# Patient Record
Sex: Male | Born: 1970 | Race: White | Hispanic: No | Marital: Single | Smoking: Current every day smoker
Health system: Southern US, Community
[De-identification: ages and names within clinical notes are randomized; demographics above are authoritative.]

## PROBLEM LIST (undated history)

## (undated) DIAGNOSIS — R1013 Epigastric pain: Secondary | ICD-10-CM

---

## 2007-01-31 ENCOUNTER — Emergency Department (HOSPITAL_COMMUNITY): Admission: EM | Admit: 2007-01-31 | Discharge: 2007-01-31 | Payer: Self-pay | Admitting: Emergency Medicine

## 2007-02-20 ENCOUNTER — Emergency Department (HOSPITAL_COMMUNITY): Admission: EM | Admit: 2007-02-20 | Discharge: 2007-02-20 | Payer: Self-pay | Admitting: Emergency Medicine

## 2007-06-04 ENCOUNTER — Emergency Department (HOSPITAL_COMMUNITY): Admission: EM | Admit: 2007-06-04 | Discharge: 2007-06-04 | Payer: Self-pay | Admitting: Family Medicine

## 2010-08-05 ENCOUNTER — Ambulatory Visit: Payer: Self-pay | Admitting: Family Medicine

## 2010-08-05 DIAGNOSIS — D179 Benign lipomatous neoplasm, unspecified: Secondary | ICD-10-CM | POA: Insufficient documentation

## 2010-08-05 DIAGNOSIS — N200 Calculus of kidney: Secondary | ICD-10-CM | POA: Insufficient documentation

## 2010-08-05 DIAGNOSIS — B019 Varicella without complication: Secondary | ICD-10-CM | POA: Insufficient documentation

## 2010-12-16 NOTE — Assessment & Plan Note (Signed)
Summary: NEW PATIENT/KNOT ON RIGHT SHOULDER/[facility]   Vital Signs:  Patient profile:   40 year old male Height:      67 inches Weight:      185.25 pounds BMI:     29.12 Temp:     98.5 degrees F oral Pulse rate:   66 / minute Pulse rhythm:   regular BP sitting:   120 / 75  (left arm) Cuff size:   regular  Vitals Entered By: Melody Comas 08-05-10 9:45 CC: New Patient with knot on right shoulder   History of Present Illness: Mass on R shoulder, prev told it was benign.  Stable for years but then recently enlarging.  Occ tender to palpation and with some pain radiating down R arm, noted w/certain movements.   Preventive Screening-Counseling & Management  Alcohol-Tobacco     Smoking Status: current  Caffeine-Diet-Exercise     Does Patient Exercise: no      Drug Use:  no.    Allergies (verified): No Known Drug Allergies  Past History:  Family History: Last updated: 08/05/2010 Family History of Arthritis, parents and grandparents Family History Diabetes 1st degree relative, parents and grandparents Family History Hypertension, parents and grandparents Family History Kidney disease, grandparents Family History Lung cancer, grandparents Family History of Stroke F 1st degree relative <60, grandparents Family history of Heart disease, grandparents F alive healthy M alive arthritis, HTN, DM2  Social History: Last updated: 08/05/2010 Occupation: Copywriter, advertising, Biomedical engineer Single no kids Current Smoker, 5 cigars a day Alcohol use-no Drug use-no Regular exercise- at work   Past Medical History:   CHICKENPOX (ICD-052.9) nephrolithiasis 1995    Family History: Reviewed history and no changes required. Family History of Arthritis, parents and grandparents Family History Diabetes 1st degree relative, parents and grandparents Family History Hypertension, parents and grandparents Family History Kidney disease, grandparents Family History Lung cancer,  grandparents Family History of Stroke F 1st degree relative <60, grandparents Family history of Heart disease, grandparents F alive healthy M alive arthritis, HTN, DM2  Social History: Reviewed history and no changes required. 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  Otherwise negative.    Physical Exam  General:  GEN: nad, alert and oriented HEENT: mucous membranes moist NECK: supple w/o LA CV: rrr.  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  Reassured.  No reason to suspect malignancy.  No LA in axilla.  would like [facility] input on removal, since he has discomfort.   Orders: Surgical Referral (Surgery)  Patient Instructions: 1)  See Aram Beecham about your referral before your leave today.  At some point in the next year, come back for a physical.  You could do it next spring.  Get an early morning appointment and we can draw your labs on the same day.  Come in fasting (don't eat breakfast and don't drink anything other than water or black coffee).  Take care.  Let me know if I can do anything else.    Prior Medications (reviewed today): None Current Allergies (reviewed today): No known  allergies

## 2011-01-26 ENCOUNTER — Encounter: Payer: Self-pay | Admitting: Family Medicine

## 2014-05-09 ENCOUNTER — Inpatient Hospital Stay: Payer: Self-pay | Admitting: Student

## 2014-05-09 LAB — URINALYSIS, COMPLETE
BACTERIA: NONE SEEN
Bilirubin,UR: NEGATIVE
GLUCOSE, UR: NEGATIVE mg/dL (ref 0–75)
Leukocyte Esterase: NEGATIVE
Nitrite: NEGATIVE
PH: 6 (ref 4.5–8.0)
PROTEIN: NEGATIVE
SPECIFIC GRAVITY: 1.056 (ref 1.003–1.030)
Squamous Epithelial: NONE SEEN

## 2014-05-09 LAB — COMPREHENSIVE METABOLIC PANEL
Albumin: 4 g/dL (ref 3.4–5.0)
Alkaline Phosphatase: 92 U/L
Anion Gap: 14 (ref 7–16)
BILIRUBIN TOTAL: 0.7 mg/dL (ref 0.2–1.0)
BUN: 10 mg/dL (ref 7–18)
CHLORIDE: 101 mmol/L (ref 98–107)
CO2: 20 mmol/L — AB (ref 21–32)
Calcium, Total: 9.5 mg/dL (ref 8.5–10.1)
Creatinine: 1.06 mg/dL (ref 0.60–1.30)
EGFR (African American): >60
GLUCOSE: 176 mg/dL — AB (ref 65–99)
OSMOLALITY: 273 (ref 275–301)
POTASSIUM: 3.2 mmol/L — AB (ref 3.5–5.1)
SGOT(AST): 14 U/L — ABNORMAL LOW (ref 15–37)
SGPT (ALT): 17 U/L (ref 12–78)
SODIUM: 135 mmol/L — AB (ref 136–145)
Total Protein: 7.7 g/dL (ref 6.4–8.2)

## 2014-05-09 LAB — CBC WITH DIFFERENTIAL/PLATELET
Basophil #: 0.2 10*3/uL — ABNORMAL HIGH (ref 0.0–0.1)
Basophil %: 1.1 %
EOS ABS: 0 10*3/uL (ref 0.0–0.7)
EOS PCT: 0.2 %
HCT: 49 % (ref 40.0–52.0)
HGB: 16.8 g/dL (ref 13.0–18.0)
LYMPHS ABS: 1.2 10*3/uL (ref 1.0–3.6)
LYMPHS PCT: 6.7 %
MCH: 31.9 pg (ref 26.0–34.0)
MCHC: 34.2 g/dL (ref 32.0–36.0)
MCV: 93 fL (ref 80–100)
MONO ABS: 1.1 x10 3/mm — AB (ref 0.2–1.0)
Monocyte %: 5.9 %
Neutrophil #: 15.4 10*3/uL — ABNORMAL HIGH (ref 1.4–6.5)
Neutrophil %: 86.1 %
PLATELETS: 243 10*3/uL (ref 150–440)
RBC: 5.26 10*6/uL (ref 4.40–5.90)
RDW: 13.6 % (ref 11.5–14.5)
WBC: 17.9 10*3/uL — AB (ref 3.8–10.6)

## 2014-05-09 LAB — LIPASE, BLOOD: Lipase: 110 U/L (ref 73–393)

## 2014-05-09 LAB — TROPONIN I: Troponin-I: <0.02 ng/mL

## 2014-05-10 LAB — MAGNESIUM: MAGNESIUM: 1.8 mg/dL

## 2014-05-10 LAB — COMPREHENSIVE METABOLIC PANEL
Albumin: 2.9 g/dL — ABNORMAL LOW (ref 3.4–5.0)
Alkaline Phosphatase: 71 U/L
Anion Gap: 6 — ABNORMAL LOW (ref 7–16)
BUN: 8 mg/dL (ref 7–18)
Bilirubin,Total: 0.4 mg/dL (ref 0.2–1.0)
CO2: 24 mmol/L (ref 21–32)
Calcium, Total: 8.1 mg/dL — ABNORMAL LOW (ref 8.5–10.1)
Chloride: 110 mmol/L — ABNORMAL HIGH (ref 98–107)
Creatinine: 1.07 mg/dL (ref 0.60–1.30)
EGFR (African American): >60
GLUCOSE: 83 mg/dL (ref 65–99)
Osmolality: 277 (ref 275–301)
POTASSIUM: 3.6 mmol/L (ref 3.5–5.1)
SGOT(AST): 16 U/L (ref 15–37)
SGPT (ALT): 11 U/L — ABNORMAL LOW (ref 12–78)
Sodium: 140 mmol/L (ref 136–145)
TOTAL PROTEIN: 6 g/dL — AB (ref 6.4–8.2)

## 2014-05-10 LAB — CBC WITH DIFFERENTIAL/PLATELET
BASOS ABS: 0 10*3/uL (ref 0.0–0.1)
BASOS PCT: 0.2 %
EOS PCT: 1.5 %
Eosinophil #: 0.2 10*3/uL (ref 0.0–0.7)
HCT: 39.8 % — AB (ref 40.0–52.0)
HGB: 13.6 g/dL (ref 13.0–18.0)
LYMPHS ABS: 2.5 10*3/uL (ref 1.0–3.6)
LYMPHS PCT: 25.4 %
MCH: 32.1 pg (ref 26.0–34.0)
MCHC: 34.1 g/dL (ref 32.0–36.0)
MCV: 94 fL (ref 80–100)
MONOS PCT: 9.6 %
Monocyte #: 1 x10 3/mm (ref 0.2–1.0)
NEUTROS ABS: 6.3 10*3/uL (ref 1.4–6.5)
Neutrophil %: 63.3 %
PLATELETS: 210 10*3/uL (ref 150–440)
RBC: 4.22 10*6/uL — ABNORMAL LOW (ref 4.40–5.90)
RDW: 13.7 % (ref 11.5–14.5)
WBC: 10 10*3/uL (ref 3.8–10.6)

## 2014-05-10 LAB — HEMOGLOBIN A1C: HEMOGLOBIN A1C: 5.6 % (ref 4.2–6.3)

## 2014-05-11 LAB — URINE CULTURE

## 2014-05-14 LAB — CULTURE, BLOOD (SINGLE)

## 2015-03-09 NOTE — H&P (Signed)
PATIENT NAME:  Tyler Bennett, Tyler Bennett MR#:  [id number] DATE OF BIRTH:  [birth year]  DATE OF ADMISSION:  05/09/2014  PRIMARY CARE PHYSICIAN: None.   REFERRING PHYSICIAN: Hinda Kehr, MD.  REASON FOR ADMISSION/CHIEF COMPLAINT: Diarrhea, dehydration, nausea and vomiting, and abdominal pain.   HISTORY OF PRESENT ILLNESS: This is a very nice 44 year old gentleman who is overall healthy. No significant prior Medi-Cal problems.   The patient comes today with a history of 3-4 days of nausea, vomiting, diarrhea, and abdominal pain. The patient states that Saturday night/Sunday morning he started having significant knife pain on the stomach, located in the left upper quadrant.   The patient had bowel movements every couple hours and pains were waxing and waning about the same time. The patient states that for the past 3 days, he has been vomiting at least 20 times in the period of 24 hours. He developed vomiting first, followed by diarrhea. The diarrhea was mostly mucus-looking, brown color, and watery. The patient denies any blood on the stool. Only when he wipes, he has streaks of blood from the irritation.   The patient denies any recent travel, any sick contact. His wife is here with him and she is not sick, and they have been together all the time. The patient states that the only food of unknown president that he had was [employer] fast food and, then last week, [place]. The patient states that his pain is located on the left upper quadrant. Right now, the intensity is 3/10 if he just lying down. Could get up to 10/10, or at least it was whenever he arrived. The pain is not radiating, felt like a knife stab and occasionally cramping. The patient states that it is not better with changes in position.  It is not better before or after bowel movements. He just happens and it goes away by itself.   The patient and has been evaluated by Dr. Karma Greaser, who noticed that his heart  rate is low in the 50s and 40s and that he had systemic inflammatory response syndrome with a white blood count of 17.9 and a lactic acid of 1.9. The patient admitted for evaluation and treatment of this condition.  REVIEW OF SYSTEMS:  Twelve system review of system is done.  CONSTITUTIONAL: The patient denies any fever. Positive fatigue. Positive weakness. No significant weight loss or weight gain.  EYES: No blurry vision, double vision.  EARS, NOSE, THROAT: No difficulty swallowing. Positive congestion at the level of the right ear mostly. Patient states that he feels very congested and occasionally has pain.  RESPIRATORY: No cough, wheezing.  CARDIOVASCULAR: No chest pain, orthopnea, or syncope.  GASTROINTESTINAL: As mentioned above on HPI, no melena, no hematemesis, and no jaundice.  GENITOURINARY: No dysuria or hematuria. Positive decreased urinary frequency.  The patient barely urinated for the past 2 days.  ENDOCRINE: No polyuria, polydipsia, polyphagia.  HEMATOLOGIC AND LYMPHATIC: No anemia, easy bruising.  SKIN: No rashes or petechiae.   MUSCULOSKELETAL: No significant neck pain or back pain.  NEUROLOGIC: No numbness or tingling.  PSYCHIATRIC: No anxiety or insomnia.   PAST MEDICAL HISTORY: None.   ALLERGIES: NOT KNOWN DRUG ALLERGIES.   PAST SURGICAL HISTORY: None.   SOCIAL HISTORY: The patient is a smoker of 1 pack a day for 30 years. He does not drink alcohol. He works as an Clinical biochemist. Lives with his wife or significant other.   FAMILY HISTORY: Barbaraann Rondo had an MI.  His father had skin cancer. His grandfather had lung cancer.   MEDICATIONS: He does not take any over-the-counter or prescribed medications.   PHYSICAL EXAMINATION:  VITAL SIGNS: Blood pressure 134/85, pulse in between 47 and 55, respirations in between 12 and 18, oxygen saturation 100% on room air, temperature 97.9. GENERAL:  Alert and oriented x 3. No acute distress. No respiratory distress.  Blood pressure 121/75.   Patient is hemodynamically stable.  HEENT: His pupils are equal and reactive. Extraocular movements are intact. Mucosae are dry. Anicteric sclerae. Pink conjunctivae. No oral lesions. No oropharyngeal exudates.  NECK: Supple. No JVD. No thyromegaly. No adenopathy.  CARDIOVASCULAR: Regular rate and rhythm. No murmurs, rubs or gallops are appreciated. No displacement of PMI.  LUNGS: Clear without any wheezing or crepitus. No use of accessory muscles.  ABDOMEN: Soft, nontender, nondistended. No hepatosplenomegaly. No masses. Bowel sounds are positive. The patient has no tenderness to palpation of the abdomen whatsoever. At this moment, maybe his abdominal sounds are normal versus a little hyper, but not significant.  No  rebound tenderness. No guarding. GENITOURINARY:  Deferred. EXTREMITIES: No edema, cyanosis, clubbing. Pulses +2. Capillary refill less than 3.  LYMPHATIC: Negative for lymphadenopathy in the neck or supraclavicular areas.  SKIN: No rashes or petechiae. Has positive decreased turgor.  PSYCHIATRIC:  Mood is normal without any significant agitation. Alert and oriented x 3.  NEUROLOGIC: Cranial nerves II-XII intact. Strength is 5/5 in all four extremities.  VASCULAR: Pulses +2. Capillary refill less than 3.   LABORATORY RESULTS: Creatinine is 1.06. His glucose is 176. The patient denies any history of diabetes. Sodium is 135, potassium is 3.2, CO2 is low at 20, electrolytes within normal limits.  LFTs, overall, within normal limits. Troponin is negative. White blood count is 17.9, hemoglobin is 16, platelet count is 243. Lactic acid is 1.9.   ABDOMEN: Flat, no acute abnormalities. Chest x-ray, also no acute abnormalities. CT scan of the abdomen and pelvis with IV contrast, but not p.o. 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 Gallbladder is normal, nonobstructive bowel gas pattern in the colon. Colonies collapse without definite mucosal  cell edema.   EKG: No ST depression or elevation. The patient had occasional PACs. The patient had some QRS without piece, looks more like junctional bradycardia. No ST depression or elevation.   ASSESSMENT AND PLAN: This is a very nice 44 year old gentleman who is healthy overall, comes in with significant diarrhea, nausea, vomiting and left upper quadrant abdominal pain.   1.  Acute gastroenteritis. At this moment, the patient is going to be admitted based on his symptomatology. Unable to tolerate p.o., significantly dehydrated with elevation of white blood count.  He does not serous criteria just yet as he only has elevation of white blood cells, but he is definitely significantly ill, for what we are going to admit him to the hospital. Treat him with IV fluids and antibiotics as he had significant leukocytosis.   The patient has been exposed too fast foods, may uncooked meat and on top of that, he has been on the [place] fishing for what we are going to cover for the most common bacteria, in this case Escherichia coli. Also, we are going to treat him for possible Giardia infection as his stool is very mucousy and could have some steatorrhea. The patient is going to be treated with ciprofloxacin and metronidazole for now. Stool culture sent. Consult GI if symptoms do not improve. At this  moment, I am not going to start any anti-diuretics until all  samples are collected and we have some results back.  Make sure he does not have Clostridium difficile.   2.  Severe dehydration. The patient looks very dry.   He has hyponatremia, hypokalemia secondary to the diarrhea, and he has known anion gap metabolic acidosis, which is also secondary to loss of bicarbonate on the stool. IV fluids given for this. Continue aggressive hydration.   3.  Leukocytosis as mentioned above. This is likely secondary to gastroenteritis. Also could be secondary to bulging compression. Treated with antibiotics. Cultures have been  sent.   4.  Hypokalemia. Replace with IV fluids with potassium.  5.  Hyponatremia. Also need to intravascular volume depletion.   6.  Hyperglycemia. The patient denies any history of diabetes at this moment. This could be secondary to stress related hormones. We are going to send a hemoglobin A1c.   7.  Lactic acidosis secondary to dehydration, likely the patient has known anion gap metabolic acidosis, which is likely due to the diarrhea.  8.  Deep vein thrombosis prophylaxis with Lovenox.   9.  Gastrointestinal prophylaxis with Protonix.   I spent about 45 minutes with this patient.  Pain control and nausea control given.     ____________________________ Sanpete Sink, MD rsg:ts D: 05/09/2014 12:24:23 ET T: 05/09/2014 13:18:27 ET JOB#: [id number]  cc: Gage Sink, MD, <Dictator> ROBERTO America Brown MD ELECTRONICALLY SIGNED 05/11/2014 15:14

## 2015-03-09 NOTE — Discharge Summary (Signed)
PATIENT NAME:  Tyler Bennett, Tyler Bennett MR#:  [id number] DATE OF BIRTH:  June 05, 1971  DATE OF ADMISSION:  05/09/2014 DATE OF DISCHARGE:  05/10/2014  The patient got admitted yesterday by Dr. Laurin Coder. The patient walked out against medical advice prior to being seen earlier this afternoon without finishing the work-up.   DIAGNOSIS AT TIME OF ADMISSION:  1.  Possible acute gastroenteritis. 2.  Dehydration. 3.  Leukocytosis. 4.  Hypokalemia. 5.  Hyponatremia.  6.  Lactic acidosis.  Work-up, of note, at this time is not complete. He was on fluids. He was on Norco p.r.n., Cipro 400 mg q. 12 hours and Flagyl 500 mg q. 8 hours, both IV, Lovenox, fluticasone nasal spray, Protonix, Zofran, promethazine p.r.n.   DIAGNOSTIC DATA: Blood cultures showed no growth to date thus far. Urine culture no growth to date thus far. UA did not suggest an infection. Lactic acid level on arrival was 1.9. White count on admission 17.9, hemoglobin 16.8. Sodium 135, potassium 3.2. Last sodium 140 and potassium 3.6.   CT of abdomen and pelvis with contrast: Small hepatic lesions consistent with hemangiomata. No acute abnormality.   Chest x-ray, 1 view, on the 24th: No active disease.   HISTORY OF PRESENT ILLNESS AND HOSPITAL COURSE: For full details of H and P, please see the dictation on June 24 by Dr. Laurin Coder, but this is a 44 year old male who has no significant past medical history, came in with 3 to 4 days of nausea, vomiting, diarrhea, and abdominal pain, and per admitting physician was dehydrated, was started on fluids. There is no recent travel or sick contacts. Of note, the patient walked out against medical advice prior to being seen by me. The risk of walking out against medical advice was explained to the patient by the staff who nonetheless still walked out against medical advice. Per staff, the family was more concerned about billing and financial implications and billing by me and left. Of note, today the white count  is normalized to 10.   ____________________________ Vivien Presto, MD sa:sb D: 05/10/2014 13:25:55 ET T: 05/10/2014 14:40:05 ET JOB#: [id number]  cc: Vivien Presto, MD, <Dictator> Vivien Presto MD ELECTRONICALLY SIGNED 05/15/2014 18:19

## 2015-03-09 NOTE — H&P (Signed)
PATIENT NAME:  Tyler Bennett, Tyler Bennett MR#:  [id number] DATE OF BIRTH:  01/10/1971  DATE OF ADMISSION:  05/09/2014  Addendum  OTHER PROBLEMS: Ear congestion. The patient has, on examination, some fluid behind the tympanic membrane. It does not look purulent, looks just serous. At this moment, we are going to give him some nasal steroids to facilitate any drainage of secretions. If this is not sufficient, once his abdominal pain, diarrhea and nausea are better, we can add on some decongestants. The antibiotics that the patient is going to have, the ciprofloxacin, might help if this is otitis media, although clinically, it does not seem like an otitis media. 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   Smoking. The patient has smoking problems. He had significant amounts of nicotine that he uses a day. Smoking cessation counseling given to the patient for over 4 minutes. He also is going to have a nicotine patch available.    ____________________________ La Jara Sink, MD rsg:lb D: 05/09/2014 13:40:07 ET T: 05/09/2014 14:40:42 ET JOB#: [id number]  cc: Gibbon Sink, MD, <Dictator> ROBERTO America Brown MD ELECTRONICALLY SIGNED 05/11/2014 15:14

## 2015-05-22 ENCOUNTER — Emergency Department (HOSPITAL_COMMUNITY): Payer: Self-pay

## 2015-05-22 ENCOUNTER — Inpatient Hospital Stay (HOSPITAL_COMMUNITY)
Admission: EM | Admit: 2015-05-22 | Discharge: 2015-05-25 | DRG: 287 | Disposition: A | Discharge status: Home / Self Care | Payer: Self-pay | Attending: Internal Medicine | Admitting: Internal Medicine

## 2015-05-22 ENCOUNTER — Encounter (HOSPITAL_COMMUNITY): Payer: Self-pay

## 2015-05-22 DIAGNOSIS — R1110 Vomiting, unspecified: Secondary | ICD-10-CM | POA: Diagnosis present

## 2015-05-22 DIAGNOSIS — Z7982 Long term (current) use of aspirin: Secondary | ICD-10-CM

## 2015-05-22 DIAGNOSIS — R931 Abnormal findings on diagnostic imaging of heart and coronary circulation: Secondary | ICD-10-CM | POA: Diagnosis not present

## 2015-05-22 DIAGNOSIS — R001 Bradycardia, unspecified: Secondary | ICD-10-CM | POA: Diagnosis present

## 2015-05-22 DIAGNOSIS — R079 Chest pain, unspecified: Principal | ICD-10-CM | POA: Diagnosis present

## 2015-05-22 DIAGNOSIS — F17200 Nicotine dependence, unspecified, uncomplicated: Secondary | ICD-10-CM | POA: Diagnosis present

## 2015-05-22 DIAGNOSIS — Z791 Long term (current) use of non-steroidal anti-inflammatories (NSAID): Secondary | ICD-10-CM

## 2015-05-22 DIAGNOSIS — R112 Nausea with vomiting, unspecified: Secondary | ICD-10-CM | POA: Diagnosis present

## 2015-05-22 LAB — HEPATIC FUNCTION PANEL
ALK PHOS: 79 U/L (ref 38–126)
ALT: 14 U/L — AB (ref 17–63)
AST: 20 U/L (ref 15–41)
Albumin: 4.8 g/dL (ref 3.5–5.0)
Bilirubin, Direct: <0.1 mg/dL — ABNORMAL LOW (ref 0.1–0.5)
TOTAL PROTEIN: 8.2 g/dL — AB (ref 6.5–8.1)
Total Bilirubin: 0.9 mg/dL (ref 0.3–1.2)

## 2015-05-22 LAB — BASIC METABOLIC PANEL
ANION GAP: 12 (ref 5–15)
BUN: 12 mg/dL (ref 6–20)
CHLORIDE: 105 mmol/L (ref 101–111)
CO2: 23 mmol/L (ref 22–32)
Calcium: 9.6 mg/dL (ref 8.9–10.3)
Creatinine, Ser: 0.99 mg/dL (ref 0.61–1.24)
Glucose, Bld: 192 mg/dL — ABNORMAL HIGH (ref 65–99)
POTASSIUM: 4.4 mmol/L (ref 3.5–5.1)
SODIUM: 140 mmol/L (ref 135–145)

## 2015-05-22 LAB — CBC
HEMATOCRIT: 44.2 % (ref 39.0–52.0)
Hemoglobin: 15.1 g/dL (ref 13.0–17.0)
MCH: 32 pg (ref 26.0–34.0)
MCHC: 34.2 g/dL (ref 30.0–36.0)
MCV: 93.6 fL (ref 78.0–100.0)
PLATELETS: 278 10*3/uL (ref 150–400)
RBC: 4.72 MIL/uL (ref 4.22–5.81)
RDW: 13.5 % (ref 11.5–15.5)
WBC: 14.4 10*3/uL — AB (ref 4.0–10.5)

## 2015-05-22 LAB — I-STAT TROPONIN, ED: Troponin i, poc: 0 ng/mL (ref 0.00–0.08)

## 2015-05-22 LAB — BRAIN NATRIURETIC PEPTIDE: B Natriuretic Peptide: 118.9 pg/mL — ABNORMAL HIGH (ref 0.0–100.0)

## 2015-05-22 LAB — LIPASE, BLOOD: Lipase: 12 U/L — ABNORMAL LOW (ref 22–51)

## 2015-05-22 LAB — TROPONIN I: Troponin I: <0.03 ng/mL (ref <0.031)

## 2015-05-22 MED ORDER — SODIUM CHLORIDE 0.9 % IV BOLUS (SEPSIS)
1000.0000 mL | Freq: Once | INTRAVENOUS | Status: AC
  Administered 2015-05-22: 1000 mL via INTRAVENOUS

## 2015-05-22 MED ORDER — ONDANSETRON HCL 4 MG/2ML IJ SOLN
4.0000 mg | Freq: Once | INTRAMUSCULAR | Status: AC
  Administered 2015-05-22: 4 mg via INTRAVENOUS
  Filled 2015-05-22: qty 2

## 2015-05-22 MED ORDER — MORPHINE SULFATE 2 MG/ML IJ SOLN
2.0000 mg | INTRAMUSCULAR | Status: DC | PRN
  Administered 2015-05-22: 2 mg via INTRAVENOUS
  Filled 2015-05-22: qty 1

## 2015-05-22 MED ORDER — NITROGLYCERIN 0.4 MG SL SUBL: 0.4000 mg | SUBLINGUAL_TABLET | SUBLINGUAL | Status: DC | PRN

## 2015-05-22 MED ORDER — ONDANSETRON HCL 4 MG/2ML IJ SOLN
4.0000 mg | Freq: Once | INTRAMUSCULAR | Status: DC
  Filled 2015-05-22: qty 2

## 2015-05-22 MED ORDER — ASPIRIN 81 MG PO CHEW
324.0000 mg | CHEWABLE_TABLET | Freq: Once | ORAL | Status: AC
  Administered 2015-05-22: 324 mg via ORAL
  Filled 2015-05-22: qty 4

## 2015-05-22 NOTE — H&P (Signed)
PCP: no pcp    Referring provider [place]   Chief Complaint:  Chest pain  HPI: Tyler Bennett is a 44 y.o. male   has no past medical history on file.   Presented with chest pain since 9 am after working on the roof. Decribes as squeezing sensation in epigastric area. Worse with eating or drinking. Every time he tries to eat he vomits. CXR was unremarkable. WBC elevated to 14. He reports some chills. Troponin is unremarkable. She attempted to take aspirin but vomited. Pain improved with nitro for 10 minutes but since has restarted. Describes pain as repeatedly squeezing. Given pulsatile nature of abdominal chest pain CT angiogram of abdomen and chest has been ordered which showed no no evidence of aortic dissection no PE. It did show some possible hepatic hemangiomas but otherwise unremarkable. Patient requested something to drink and was not able to tolerate fluids. Patient prompted lately vomited after drinking orange juice and his chest pain has resumed. At this point we'll admit for rehydration and supportive therapy for persistent nausea and vomiting. Serial EKG was unchanged. Troponin negative 2. Patient wasn't noted to be somewhat bradycardic down to 46.  Hospitalist was called for admission for chest pain/epigastric pain with intractable nausea and vomiting.  Review of Systems:    Pertinent positives include:  Chills, epigastric pain. shortness of breath at rest.  Constitutional:  No weight loss, night sweats, Fevers,  fatigue, weight loss  HEENT:  No headaches, Difficulty swallowing,Tooth/dental problems,Sore throat,  No sneezing, itching, ear ache, nasal congestion, post nasal drip,  Cardio-vascular:  No chest pain, Orthopnea, PND, anasarca, dizziness, palpitations.no Bilateral lower extremity swelling  GI:  No heartburn, indigestion, abdominal pain, nausea, vomiting, diarrhea, change in bowel habits, loss of appetite, melena, blood in stool, hematemesis Resp:  no  No  dyspnea on exertion, No excess mucus, no productive cough, No non-productive cough, No coughing up of blood.No change in color of mucus.No wheezing. Skin:  no rash or lesions. No jaundice GU:  no dysuria, change in color of urine, no urgency or frequency. No straining to urinate.  No flank pain.  Musculoskeletal:  No joint pain or no joint swelling. No decreased range of motion. No back pain.  Psych:  No change in mood or affect. No depression or anxiety. No memory loss.  Neuro: no localizing neurological complaints, no tingling, no weakness, no double vision, no gait abnormality, no slurred speech, no confusion  Otherwise ROS are negative except for above, 10 systems were reviewed  Past Medical History: History reviewed. No pertinent past medical history. History reviewed. No pertinent past surgical history.   Medications: Prior to Admission medications   Medication Sig Start Date End Date Taking? Authorizing Provider  AMOXICILLIN PO Take 1 capsule by mouth 3 (three) times daily.   Yes Historical Provider, MD  anti-nausea (EMETROL) solution Take 15 mLs by mouth every 15 (fifteen) minutes as needed for nausea or vomiting.   Yes Historical Provider, MD  aspirin 81 MG chewable tablet Chew 81 mg by mouth once.   Yes Historical Provider, MD  ibuprofen (ADVIL,MOTRIN) 200 MG tablet Take 400 mg by mouth every 6 (six) hours as needed for moderate pain.   Yes Historical Provider, MD    Allergies:  No Known Allergies  Social History:  Ambulatory   independently   Lives at home alone,       reports that he has been smoking.  He does not have any smokeless tobacco history on file. He  reports that he does not drink alcohol or use illicit drugs.    Family History: family history includes CAD in his other; Diabetes type II in his mother; Heart failure in his mother; Hyperlipidemia in his father.    Physical Exam: Patient Vitals for the past 24 hrs:  BP Temp Temp src Pulse Resp SpO2  Height Weight  05/22/15 2223 111/64 mmHg - - 68 18 100 % - -  05/22/15 2123 127/66 mmHg - - 60 18 100 % - -  05/22/15 2020 130/80 mmHg 98.1 F (36.7 C) Oral 60 18 97 % 5\' 8"  (1.727 m) 81.647 kg (180 lb)    1. General:  in No Acute distress 2. Psychological: Alert and   Oriented 3. Head/ENT:   Moist  Mucous Membranes                          Head Non traumatic, neck supple                          Normal  Dentition 4. SKIN:  decreased Skin turgor,  Skin clean Dry and intact no rash 5. Heart: Regular rate and rhythm systolic Murmur noted, Rub or gallop 6. Lungs: Clear to auscultation bilaterally, no wheezes or crackles   7. Abdomen: Soft, non-tender, Non distended,   8. Lower extremities: no clubbing, cyanosis, or edema 9. Neurologically Grossly intact, moving all 4 extremities equally 10. MSK: Normal range of motion  body mass index is 27.38 kg/(m^2).   Labs on Admission:   Results for orders placed or performed during the hospital encounter of 05/22/15 (from the past 24 hour(s))  CBC     Status: Abnormal   Collection Time: 05/22/15  8:52 PM  Result Value Ref Range   WBC 14.4 (H) 4.0 - 10.5 K/uL   RBC 4.72 4.22 - 5.81 MIL/uL   Hemoglobin 15.1 13.0 - 17.0 g/dL   HCT 44.2 39.0 - 52.0 %   MCV 93.6 78.0 - 100.0 fL   MCH 32.0 26.0 - 34.0 pg   MCHC 34.2 30.0 - 36.0 g/dL   RDW 13.5 11.5 - 15.5 %   Platelets 278 150 - 400 K/uL  Basic metabolic panel     Status: Abnormal   Collection Time: 05/22/15  8:52 PM  Result Value Ref Range   Sodium 140 135 - 145 mmol/L   Potassium 4.4 3.5 - 5.1 mmol/L   Chloride 105 101 - 111 mmol/L   CO2 23 22 - 32 mmol/L   Glucose, Bld 192 (H) 65 - 99 mg/dL   BUN 12 6 - 20 mg/dL   Creatinine, Ser 0.99 0.61 - 1.24 mg/dL   Calcium 9.6 8.9 - 10.3 mg/dL   GFR calc non Af Amer >60 >60 mL/min   GFR calc Af Amer >60 >60 mL/min   Anion gap 12 5 - 15  BNP (order ONLY if patient complains of dyspnea/SOB AND you have documented it for THIS visit)     Status:  Abnormal   Collection Time: 05/22/15  8:52 PM  Result Value Ref Range   B Natriuretic Peptide 118.9 (H) 0.0 - 100.0 pg/mL  I-stat troponin, ED  (not at [facility])     Status: None   Collection Time: 05/22/15  8:55 PM  Result Value Ref Range   Troponin i, poc 0.00 0.00 - 0.08 ng/mL   Comment 3  Lipase, blood     Status: Abnormal   Collection Time: 05/22/15  8:56 PM  Result Value Ref Range   Lipase 12 (L) 22 - 51 U/L  Hepatic function panel     Status: Abnormal   Collection Time: 05/22/15  8:56 PM  Result Value Ref Range   Total Protein 8.2 (H) 6.5 - 8.1 g/dL   Albumin 4.8 3.5 - 5.0 g/dL   AST 20 15 - 41 U/L   ALT 14 (L) 17 - 63 U/L   Alkaline Phosphatase 79 38 - 126 U/L   Total Bilirubin 0.9 0.3 - 1.2 mg/dL   Bilirubin, Direct <0.1 (L) 0.1 - 0.5 mg/dL   Indirect Bilirubin NOT CALCULATED 0.3 - 0.9 mg/dL    UA ordered  No results found for: HGBA1C  Estimated Creatinine Clearance: 91.2 mL/min (by C-G formula based on Cr of 0.99).  BNP (last 3 results) No results for input(s): PROBNP in the last 8760 hours.  Other results:  I have pearsonaly reviewed this: ECG REPORT  Rate: 49  Rhythm: abnormal R wave progression ST&T Change: no ischemic changes   Filed Weights   05/22/15 2020  Weight: 81.647 kg (180 lb)     Cultures: No results found for: SDES, SPECREQUEST, CULT, REPTSTATUS   Radiological Exams on Admission: Dg Chest Port 1 View  05/22/2015   CLINICAL DATA:  Mid chest pain. Shortness of breath. Productive cough. Vomiting.  EXAM: PORTABLE CHEST - 1 VIEW  COMPARISON:  05/09/2014  FINDINGS: The heart size and mediastinal contours are within normal limits. Both lungs are clear. The visualized skeletal structures are unremarkable.  IMPRESSION: No active disease.   Electronically Signed   By: Earle Gell M.D.   On: 05/22/2015 20:50    Chart has been reviewed  Family  at  Bedside  plan of care was discussed with   Danelle Berry  [phone]  Assessment/Plan  44 year old gentleman with sudden onset of chest pain was factors involving tobacco abuse, persistent nausea and vomiting. Troponin negative 2. EKG with nonspecific changes. We'll admit for father evaluation acute coronary syndrome somewhat unlikely given the chest pain going on since 9 AM so far work up is normal   Present on Admission:  . Chest pain - Will admit to telemetry cycle cardiac enzymes. Obtain echo gram check lipid panel check TSH  . Intractable nausea and vomiting - etiology unclear could be gastroenteritis. Lipase normal CT scan of abdomen is unremarkable. We'll continue to monitor give IV fluids give Reglan and GI cocktail Bradycardia. Your records patient has had low heart rate in the past although never quieted as low. Appears to be sinus bradycardia on EKG we will obtain echo and check TSH  Prophylaxis:   Lovenox   CODE STATUS:  FULL CODE   as per patient    Disposition:  To home once workup is complete and patient is stable  Other plan as per orders.  I have spent a total of 55 min on this admission  Zyden Suman 05/22/2015, 11:34 PM  [facility]  Pager [phone]   after 2 AM please page floor coverage PA If 7AM-7PM, please contact the day team taking care of the patient  [email or web address]  Password [email or web address]

## 2015-05-22 NOTE — ED Notes (Signed)
Pt complains of a pressure squeezing type chest pain since earlier today, he is also vomiting in triage and complains of being nauseated.

## 2015-05-22 NOTE — ED Provider Notes (Addendum)
CSN: [id number]     Arrival date & time 05/22/15  2010 History   First MD Initiated Contact with Patient 05/22/15 2117     Chief Complaint  Patient presents with  . Chest Pain     (Consider location/radiation/quality/duration/timing/severity/associated sxs/prior Treatment) HPI  44 year old male presents with chest pain that he describes as squeezing in the middle of his chest since 9 or 10 AM. The pain has been constant with intermittent nausea and vomiting. He feels short of breath with the pain as well. Earlier in the day he was feeling bilateral shoulder numbness that has since resolved. Getting up and walking seems to make the pain worse. Occasionally the pain does worsen and he becomes more pale and "disoriented". Patient is ever had pain like this before. He has not tried eating due to the vomiting. There is no abdominal pain, only lower chest pain. Pain does not radiate to his back. Some increased pain with inspiration. No cough or fevers.  History reviewed. No pertinent past medical history. History reviewed. No pertinent past surgical history. History reviewed. No pertinent family history. History  Substance Use Topics  . Smoking status: Current Every Day Smoker  . Smokeless tobacco: Not on file  . Alcohol Use: Yes    Review of Systems  Constitutional: Positive for fever and chills.  Respiratory: Positive for shortness of breath.   Cardiovascular: Positive for chest pain. Negative for leg swelling.  Gastrointestinal: Positive for nausea and vomiting. Negative for abdominal pain.  Skin: Positive for pallor.  Neurological: Positive for dizziness.  All other systems reviewed and are negative.     Allergies  Review of patient's allergies indicates no known allergies.  Home Medications   Prior to Admission medications   Medication Sig Start Date End Date Taking? Authorizing Provider  AMOXICILLIN PO Take 1 capsule by mouth 3 (three) times daily.   Yes Historical Provider,  MD  anti-nausea (EMETROL) solution Take 15 mLs by mouth every 15 (fifteen) minutes as needed for nausea or vomiting.   Yes Historical Provider, MD  aspirin 81 MG chewable tablet Chew 81 mg by mouth once.   Yes Historical Provider, MD  ibuprofen (ADVIL,MOTRIN) 200 MG tablet Take 400 mg by mouth every 6 (six) hours as needed for moderate pain.   Yes Historical Provider, MD   BP 127/66 mmHg  Pulse 60  Temp(Src) 98.1 F (36.7 C) (Oral)  Resp 18  Ht 5\' 8"  (1.727 m)  Wt 180 lb (81.647 kg)  BMI 27.38 kg/m2  SpO2 100% Physical Exam  Constitutional: He is oriented to person, place, and time. He appears well-developed and well-nourished. No distress.  HENT:  Head: Normocephalic and atraumatic.  Right Ear: External ear normal.  Left Ear: External ear normal.  Nose: Nose normal.  Eyes: Right eye exhibits no discharge. Left eye exhibits no discharge.  Neck: Neck supple.  Cardiovascular: Normal rate, regular rhythm, normal heart sounds and intact distal pulses.   Pulses:      Radial pulses are 2+ on the right side, and 2+ on the left side.  Pulmonary/Chest: Effort normal and breath sounds normal. He exhibits no tenderness.  Abdominal: Soft. He exhibits no distension. There is no tenderness.  Musculoskeletal: He exhibits no edema.  Neurological: He is alert and oriented to person, place, and time.  Skin: Skin is warm and dry. He is not diaphoretic. There is pallor.  Nursing note and vitals reviewed.   ED Course  Procedures (including critical care time) Labs Review Labs  Reviewed  CBC - Abnormal; Notable for the following:    WBC 14.4 (*)    All other components within normal limits  BASIC METABOLIC PANEL - Abnormal; Notable for the following:    Glucose, Bld 192 (*)    All other components within normal limits  BRAIN NATRIURETIC PEPTIDE  LIPASE, BLOOD  HEPATIC FUNCTION PANEL  Randolm Idol, ED    Imaging Review Dg Chest Port 1 View  05/22/2015   CLINICAL DATA:  Mid chest pain.  Shortness of breath. Productive cough. Vomiting.  EXAM: PORTABLE CHEST - 1 VIEW  COMPARISON:  05/09/2014  FINDINGS: The heart size and mediastinal contours are within normal limits. Both lungs are clear. The visualized skeletal structures are unremarkable.  IMPRESSION: No active disease.   Electronically Signed   By: Earle Gell M.D.   On: 05/22/2015 20:50     EKG Interpretation   Date/Time:  Wednesday May 22 2015 20:36:51 EDT Ventricular Rate:  59 PR Interval:  164 QRS Duration: 81 QT Interval:  433 QTC Calculation: 429 R Axis:   58 Text Interpretation:  Sinus rhythm Left atrial enlargement ST elevation,  consider inferior injury Since last tracing Nonspecific ST abnormality  Confirmed by [facility]  MD, ELLIOTT [phone]) on 05/22/2015 8:41:37 PM        EKG Interpretation  Date/Time:  Wednesday May 22 2015 21:55:45 EDT Ventricular Rate:  49 PR Interval:  168 QRS Duration: 91 QT Interval:  483 QTC Calculation: 436 R Axis:   64 Text Interpretation:  Slow sinus arrhythmia Probable left atrial enlargement Abnormal R-wave progression, early transition no significant change since earlier in the day Confirmed by [place] ([id number]) on 05/22/2015 10:20:57 PM       MDM   Final diagnoses:  Chest pain, unspecified chest pain type    Patient's history is concerning for a cardiac cause of his symptoms. Initial troponin is negative and he has nonspecific EKG changes. Patient's HEART score is a 4. He is low risk for pulmonary embolism and is PERC negative. Patient given aspirin which he threw up as well as Zofran. After this he felt all the symptoms completely resolve never required nitroglycerin. No known drug use, will send UDS after hospitalist request. Will need overnight observation for moderate risk for ACS chest pain.    Sherwood Gambler, MD 05/22/15 2307  Patient's pain has come back, hospitalist is concerned for dissection, will order CTA of chest/abd/pelvis and hold off on  admission to floor until CT is back.  Sherwood Gambler, MD 05/22/15 2352  Sherwood Gambler, MD 05/22/15 2352

## 2015-05-22 NOTE — ED Notes (Signed)
Repeat EKG given to [facility] for review.

## 2015-05-22 NOTE — ED Notes (Signed)
Pt became disoriented after returning from xray

## 2015-05-22 NOTE — ED Notes (Signed)
EKG given to Hospitalist,[place] for review.

## 2015-05-23 ENCOUNTER — Ambulatory Visit (HOSPITAL_COMMUNITY)
Admit: 2015-05-23 | Discharge: 2015-05-23 | Disposition: A | Discharge status: Home / Self Care | Payer: Self-pay | Attending: Cardiology | Admitting: Cardiology

## 2015-05-23 ENCOUNTER — Ambulatory Visit (HOSPITAL_COMMUNITY)
Admit: 2015-05-23 | Discharge: 2015-05-23 | Disposition: A | Discharge status: Home / Self Care | Payer: MEDICAID | Attending: Cardiology | Admitting: Cardiology

## 2015-05-23 ENCOUNTER — Encounter (HOSPITAL_COMMUNITY): Payer: Self-pay

## 2015-05-23 ENCOUNTER — Inpatient Hospital Stay (HOSPITAL_COMMUNITY): Payer: Self-pay

## 2015-05-23 ENCOUNTER — Observation Stay (HOSPITAL_COMMUNITY): Payer: Self-pay

## 2015-05-23 DIAGNOSIS — R112 Nausea with vomiting, unspecified: Secondary | ICD-10-CM | POA: Diagnosis present

## 2015-05-23 DIAGNOSIS — F17200 Nicotine dependence, unspecified, uncomplicated: Secondary | ICD-10-CM | POA: Diagnosis present

## 2015-05-23 DIAGNOSIS — R079 Chest pain, unspecified: Principal | ICD-10-CM

## 2015-05-23 DIAGNOSIS — Z720 Tobacco use: Secondary | ICD-10-CM

## 2015-05-23 LAB — RAPID URINE DRUG SCREEN, HOSP PERFORMED
Amphetamines: NOT DETECTED
BARBITURATES: NOT DETECTED
Benzodiazepines: NOT DETECTED
Cocaine: NOT DETECTED
Opiates: NOT DETECTED
Tetrahydrocannabinol: POSITIVE — AB

## 2015-05-23 LAB — CBC
HEMATOCRIT: 39.9 % (ref 39.0–52.0)
Hemoglobin: 13.3 g/dL (ref 13.0–17.0)
MCH: 31.2 pg (ref 26.0–34.0)
MCHC: 33.3 g/dL (ref 30.0–36.0)
MCV: 93.7 fL (ref 78.0–100.0)
Platelets: 231 10*3/uL (ref 150–400)
RBC: 4.26 MIL/uL (ref 4.22–5.81)
RDW: 13.7 % (ref 11.5–15.5)
WBC: 13 10*3/uL — ABNORMAL HIGH (ref 4.0–10.5)

## 2015-05-23 LAB — COMPREHENSIVE METABOLIC PANEL
ALBUMIN: 4.2 g/dL (ref 3.5–5.0)
ALT: 12 U/L — AB (ref 17–63)
AST: 16 U/L (ref 15–41)
Alkaline Phosphatase: 72 U/L (ref 38–126)
Anion gap: 7 (ref 5–15)
BUN: 11 mg/dL (ref 6–20)
CO2: 23 mmol/L (ref 22–32)
CREATININE: 0.91 mg/dL (ref 0.61–1.24)
Calcium: 8.9 mg/dL (ref 8.9–10.3)
Chloride: 108 mmol/L (ref 101–111)
GFR calc Af Amer: >60 mL/min (ref >60)
GFR calc non Af Amer: >60 mL/min (ref >60)
GLUCOSE: 132 mg/dL — AB (ref 65–99)
Potassium: 3.7 mmol/L (ref 3.5–5.1)
Sodium: 138 mmol/L (ref 135–145)
TOTAL PROTEIN: 6.8 g/dL (ref 6.5–8.1)
Total Bilirubin: 0.6 mg/dL (ref 0.3–1.2)

## 2015-05-23 LAB — LIPASE, BLOOD: Lipase: 31 U/L (ref 22–51)

## 2015-05-23 LAB — NM MYOCAR MULTI W/SPECT W/WALL MOTION / EF
Estimated workload: 10.9 METS
Exercise duration (min): 9 min
Exercise duration (sec): 30 s
LV dias vol: 175 mL
LV sys vol: 81 mL
MPHR: 175 {beats}/min
Peak BP: 166 mmHg
Peak HR: 184 {beats}/min
Percent HR: 94 %
RATE: 0.23
RPE: 10
Rest HR: 48 {beats}/min
SDS: 4
SRS: 0
SSS: 4
TID: 1.04

## 2015-05-23 LAB — LIPID PANEL
CHOL/HDL RATIO: 5.9 ratio
CHOLESTEROL: 166 mg/dL (ref 0–200)
HDL: 28 mg/dL — AB (ref >40)
LDL Cholesterol: 120 mg/dL — ABNORMAL HIGH (ref 0–99)
TRIGLYCERIDES: 91 mg/dL (ref <150)
VLDL: 18 mg/dL (ref 0–40)

## 2015-05-23 LAB — PHOSPHORUS: PHOSPHORUS: 2.3 mg/dL — AB (ref 2.5–4.6)

## 2015-05-23 LAB — TROPONIN I: Troponin I: <0.03 ng/mL (ref <0.031)

## 2015-05-23 LAB — TSH: TSH: 0.473 u[IU]/mL (ref 0.350–4.500)

## 2015-05-23 LAB — MAGNESIUM: MAGNESIUM: 1.9 mg/dL (ref 1.7–2.4)

## 2015-05-23 MED ORDER — ACETAMINOPHEN 325 MG PO TABS: 650.0000 mg | ORAL_TABLET | Freq: Four times a day (QID) | ORAL | Status: DC | PRN

## 2015-05-23 MED ORDER — ONDANSETRON HCL 4 MG/2ML IJ SOLN
INTRAMUSCULAR | Status: AC
  Filled 2015-05-23: qty 2

## 2015-05-23 MED ORDER — PROMETHAZINE HCL 25 MG/ML IJ SOLN
INTRAMUSCULAR | Status: AC
  Filled 2015-05-23: qty 1

## 2015-05-23 MED ORDER — ENOXAPARIN SODIUM 40 MG/0.4ML ~~LOC~~ SOLN
40.0000 mg | SUBCUTANEOUS | Status: DC
  Administered 2015-05-23: 40 mg via SUBCUTANEOUS
  Filled 2015-05-23: qty 0.4

## 2015-05-23 MED ORDER — ATORVASTATIN CALCIUM 80 MG PO TABS
80.0000 mg | ORAL_TABLET | Freq: Every day | ORAL | Status: DC
  Administered 2015-05-23 – 2015-05-24 (×2): 80 mg via ORAL
  Filled 2015-05-23: qty 2
  Filled 2015-05-23: qty 1

## 2015-05-23 MED ORDER — TECHNETIUM TC 99M SESTAMIBI GENERIC - CARDIOLITE
30.0000 | Freq: Once | INTRAVENOUS | Status: AC | PRN
  Administered 2015-05-23: 30 via INTRAVENOUS

## 2015-05-23 MED ORDER — ONDANSETRON HCL 4 MG PO TABS
4.0000 mg | ORAL_TABLET | Freq: Four times a day (QID) | ORAL | Status: DC | PRN
  Administered 2015-05-24: 4 mg via ORAL

## 2015-05-23 MED ORDER — PANTOPRAZOLE SODIUM 40 MG PO TBEC
40.0000 mg | DELAYED_RELEASE_TABLET | Freq: Every day | ORAL | Status: DC
  Administered 2015-05-23: 40 mg via ORAL
  Filled 2015-05-23: qty 1

## 2015-05-23 MED ORDER — SODIUM CHLORIDE 0.9 % IV SOLN
INTRAVENOUS | Status: DC
  Administered 2015-05-23 (×2): via INTRAVENOUS

## 2015-05-23 MED ORDER — ONDANSETRON HCL 4 MG/2ML IJ SOLN
4.0000 mg | Freq: Once | INTRAMUSCULAR | Status: AC
  Administered 2015-05-23: 4 mg via INTRAVENOUS

## 2015-05-23 MED ORDER — GI COCKTAIL ~~LOC~~
30.0000 mL | Freq: Once | ORAL | Status: AC
  Administered 2015-05-23: 30 mL via ORAL
  Filled 2015-05-23: qty 30

## 2015-05-23 MED ORDER — METOCLOPRAMIDE HCL 5 MG/ML IJ SOLN
5.0000 mg | Freq: Four times a day (QID) | INTRAMUSCULAR | Status: DC
  Administered 2015-05-23 – 2015-05-25 (×9): 5 mg via INTRAVENOUS
  Filled 2015-05-23: qty 2
  Filled 2015-05-23: qty 1
  Filled 2015-05-23: qty 2
  Filled 2015-05-23 (×2): qty 1
  Filled 2015-05-23 (×2): qty 2
  Filled 2015-05-23: qty 1
  Filled 2015-05-23 (×2): qty 2
  Filled 2015-05-23 (×3): qty 1

## 2015-05-23 MED ORDER — TECHNETIUM TC 99M SESTAMIBI GENERIC - CARDIOLITE
10.0000 | Freq: Once | INTRAVENOUS | Status: AC | PRN
  Administered 2015-05-23: 10 via INTRAVENOUS

## 2015-05-23 MED ORDER — IOHEXOL 350 MG/ML SOLN
100.0000 mL | Freq: Once | INTRAVENOUS | Status: AC | PRN
  Administered 2015-05-23: 100 mL via INTRAVENOUS

## 2015-05-23 MED ORDER — ACETAMINOPHEN 650 MG RE SUPP: 650.0000 mg | Freq: Four times a day (QID) | RECTAL | Status: DC | PRN

## 2015-05-23 MED ORDER — PROMETHAZINE HCL 25 MG/ML IJ SOLN
12.5000 mg | Freq: Once | INTRAMUSCULAR | Status: AC
  Administered 2015-05-23: 12.5 mg via INTRAVENOUS

## 2015-05-23 MED ORDER — PROMETHAZINE HCL 25 MG/ML IJ SOLN: 12.5000 mg | Freq: Once | INTRAMUSCULAR | Status: DC

## 2015-05-23 MED ORDER — ONDANSETRON HCL 4 MG/2ML IJ SOLN
4.0000 mg | Freq: Four times a day (QID) | INTRAMUSCULAR | Status: DC | PRN
  Administered 2015-05-23 – 2015-05-25 (×3): 4 mg via INTRAVENOUS
  Filled 2015-05-23 (×3): qty 2

## 2015-05-23 MED ORDER — SODIUM CHLORIDE 0.9 % IJ SOLN
3.0000 mL | Freq: Two times a day (BID) | INTRAMUSCULAR | Status: DC
  Administered 2015-05-23 – 2015-05-24 (×3): 3 mL via INTRAVENOUS

## 2015-05-23 MED ORDER — ASPIRIN EC 81 MG PO TBEC
81.0000 mg | DELAYED_RELEASE_TABLET | Freq: Every day | ORAL | Status: DC
  Administered 2015-05-23 – 2015-05-24 (×2): 81 mg via ORAL
  Filled 2015-05-23 (×2): qty 1

## 2015-05-23 MED ORDER — HYDROCODONE-ACETAMINOPHEN 5-325 MG PO TABS
1.0000 | ORAL_TABLET | ORAL | Status: DC | PRN
  Administered 2015-05-23 (×2): 2 via ORAL
  Filled 2015-05-23 (×2): qty 2

## 2015-05-23 NOTE — Consult Note (Signed)
Reason for Consult:   Chest pain  Requesting Physician: [facility] Primary Cardiologist Dr Tamala Julian (new)  HPI: This is a 44 y.o. male with a past medical history significant for smoking. He presented 05/22/15 after developing SSCP. He had been working on roof and went into his truck. When in the [facility] he developed "pulsing" SSCP with arm numbness and nausea and vomiting. He came to the ED last pm secondary to persistent nausea and vomiting. EKG with NSST changes, Troponin negative x 3. No further chest discomfort since admission.   PMHx:  History reviewed. No pertinent past medical history.  History reviewed. No pertinent past surgical history.  SOCHx:  reports that he has been smoking.  He does not have any smokeless tobacco history on file. He reports that he does not drink alcohol or use illicit drugs.  FAMHx: Family History  Problem Relation Age of Onset  . Diabetes type II Mother   . Heart failure Mother   . Hyperlipidemia Father   . CAD Other     ALLERGIES: No Known Allergies  ROS: see H&P for complete ROS  HOME MEDICATIONS: Prior to Admission medications   Medication Sig Start Date End Date Taking? Authorizing Provider  AMOXICILLIN PO Take 1 capsule by mouth 3 (three) times daily.   Yes Historical Provider, MD  anti-nausea (EMETROL) solution Take 15 mLs by mouth every 15 (fifteen) minutes as needed for nausea or vomiting.   Yes Historical Provider, MD  aspirin 81 MG chewable tablet Chew 81 mg by mouth once.   Yes Historical Provider, MD  ibuprofen (ADVIL,MOTRIN) 200 MG tablet Take 400 mg by mouth every 6 (six) hours as needed for moderate pain.   Yes Historical Provider, MD    HOSPITAL MEDICATIONS: I have reviewed the patient's current medications.  VITALS: Blood pressure 130/64, pulse 51, temperature 98.3 F (36.8 C), temperature source Oral, resp. rate 18, height 5\' 8"  (1.727 m), weight 180 lb (81.647 kg), SpO2 98 %.  PHYSICAL EXAM: General  appearance: alert, cooperative and no distress Neck: no carotid bruit and no JVD Lungs: scattered wheezing Heart: regular rate and rhythm Abdomen: soft, non-tender; bowel sounds normal; no masses,  no organomegaly Extremities: extremities normal, atraumatic, no cyanosis or edema Pulses: 2+ and symmetric Skin: Skin color, texture, turgor normal. No rashes or lesions Neurologic: Grossly normal  LABS: Results for orders placed or performed during the hospital encounter of 05/22/15 (from the past 24 hour(s))  CBC     Status: Abnormal   Collection Time: 05/22/15  8:52 PM  Result Value Ref Range   WBC 14.4 (H) 4.0 - 10.5 K/uL   RBC 4.72 4.22 - 5.81 MIL/uL   Hemoglobin 15.1 13.0 - 17.0 g/dL   HCT 44.2 39.0 - 52.0 %   MCV 93.6 78.0 - 100.0 fL   MCH 32.0 26.0 - 34.0 pg   MCHC 34.2 30.0 - 36.0 g/dL   RDW 13.5 11.5 - 15.5 %   Platelets 278 150 - 400 K/uL  Basic metabolic panel     Status: Abnormal   Collection Time: 05/22/15  8:52 PM  Result Value Ref Range   Sodium 140 135 - 145 mmol/L   Potassium 4.4 3.5 - 5.1 mmol/L   Chloride 105 101 - 111 mmol/L   CO2 23 22 - 32 mmol/L   Glucose, Bld 192 (H) 65 - 99 mg/dL   BUN 12 6 - 20 mg/dL   Creatinine, Ser 0.99 0.61 -  1.24 mg/dL   Calcium 9.6 8.9 - 10.3 mg/dL   GFR calc non Af Amer >60 >60 mL/min   GFR calc Af Amer >60 >60 mL/min   Anion gap 12 5 - 15  BNP (order ONLY if patient complains of dyspnea/SOB AND you have documented it for THIS visit)     Status: Abnormal   Collection Time: 05/22/15  8:52 PM  Result Value Ref Range   B Natriuretic Peptide 118.9 (H) 0.0 - 100.0 pg/mL  I-stat troponin, ED  (not at [facility])     Status: None   Collection Time: 05/22/15  8:55 PM  Result Value Ref Range   Troponin i, poc 0.00 0.00 - 0.08 ng/mL   Comment 3          Lipase, blood     Status: Abnormal   Collection Time: 05/22/15  8:56 PM  Result Value Ref Range   Lipase 12 (L) 22 - 51 U/L  Hepatic function panel     Status: Abnormal   Collection  Time: 05/22/15  8:56 PM  Result Value Ref Range   Total Protein 8.2 (H) 6.5 - 8.1 g/dL   Albumin 4.8 3.5 - 5.0 g/dL   AST 20 15 - 41 U/L   ALT 14 (L) 17 - 63 U/L   Alkaline Phosphatase 79 38 - 126 U/L   Total Bilirubin 0.9 0.3 - 1.2 mg/dL   Bilirubin, Direct <0.1 (L) 0.1 - 0.5 mg/dL   Indirect Bilirubin NOT CALCULATED 0.3 - 0.9 mg/dL  Troponin I     Status: None   Collection Time: 05/22/15 11:03 PM  Result Value Ref Range   Troponin I <0.03 <0.031 ng/mL  Urine rapid drug screen (hosp performed)     Status: Abnormal   Collection Time: 05/22/15 11:33 PM  Result Value Ref Range   Opiates NONE DETECTED NONE DETECTED   Cocaine NONE DETECTED NONE DETECTED   Benzodiazepines NONE DETECTED NONE DETECTED   Amphetamines NONE DETECTED NONE DETECTED   Tetrahydrocannabinol POSITIVE (A) NONE DETECTED   Barbiturates NONE DETECTED NONE DETECTED  Lipid panel     Status: Abnormal   Collection Time: 05/23/15  4:33 AM  Result Value Ref Range   Cholesterol 166 0 - 200 mg/dL   Triglycerides 91 <150 mg/dL   HDL 28 (L) >40 mg/dL   Total CHOL/HDL Ratio 5.9 RATIO   VLDL 18 0 - 40 mg/dL   LDL Cholesterol 120 (H) 0 - 99 mg/dL  Lipase, blood     Status: None   Collection Time: 05/23/15  4:33 AM  Result Value Ref Range   Lipase 31 22 - 51 U/L  Troponin I (q 6hr x 3)     Status: None   Collection Time: 05/23/15  4:33 AM  Result Value Ref Range   Troponin I <0.03 <0.031 ng/mL  Magnesium     Status: None   Collection Time: 05/23/15  4:33 AM  Result Value Ref Range   Magnesium 1.9 1.7 - 2.4 mg/dL  Phosphorus     Status: Abnormal   Collection Time: 05/23/15  4:33 AM  Result Value Ref Range   Phosphorus 2.3 (L) 2.5 - 4.6 mg/dL  TSH     Status: None   Collection Time: 05/23/15  4:33 AM  Result Value Ref Range   TSH 0.473 0.350 - 4.500 uIU/mL  CBC     Status: Abnormal   Collection Time: 05/23/15  4:33 AM  Result Value Ref Range   WBC  13.0 (H) 4.0 - 10.5 K/uL   RBC 4.26 4.22 - 5.81 MIL/uL    Hemoglobin 13.3 13.0 - 17.0 g/dL   HCT 39.9 39.0 - 52.0 %   MCV 93.7 78.0 - 100.0 fL   MCH 31.2 26.0 - 34.0 pg   MCHC 33.3 30.0 - 36.0 g/dL   RDW 13.7 11.5 - 15.5 %   Platelets 231 150 - 400 K/uL  Comprehensive metabolic panel     Status: Abnormal   Collection Time: 05/23/15  4:33 AM  Result Value Ref Range   Sodium 138 135 - 145 mmol/L   Potassium 3.7 3.5 - 5.1 mmol/L   Chloride 108 101 - 111 mmol/L   CO2 23 22 - 32 mmol/L   Glucose, Bld 132 (H) 65 - 99 mg/dL   BUN 11 6 - 20 mg/dL   Creatinine, Ser 0.91 0.61 - 1.24 mg/dL   Calcium 8.9 8.9 - 10.3 mg/dL   Total Protein 6.8 6.5 - 8.1 g/dL   Albumin 4.2 3.5 - 5.0 g/dL   AST 16 15 - 41 U/L   ALT 12 (L) 17 - 63 U/L   Alkaline Phosphatase 72 38 - 126 U/L   Total Bilirubin 0.6 0.3 - 1.2 mg/dL   GFR calc non Af Amer >60 >60 mL/min   GFR calc Af Amer >60 >60 mL/min   Anion gap 7 5 - 15    EKG: NSR, SB, tall R in V1  IMAGING: Dg Chest Port 1 View  05/22/2015   CLINICAL DATA:  Mid chest pain. Shortness of breath. Productive cough. Vomiting.  EXAM: PORTABLE CHEST - 1 VIEW  COMPARISON:  05/09/2014  FINDINGS: The heart size and mediastinal contours are within normal limits. Both lungs are clear. The visualized skeletal structures are unremarkable.  IMPRESSION: No active disease.   Electronically Signed   By: Earle Gell M.D.   On: 05/22/2015 20:50   Ct Angio Chest Aorta W/cm &/or Wo/cm  05/23/2015   CLINICAL DATA:  44 year old male with chest pain.  EXAM: CT ANGIOGRAPHY CHEST, ABDOMEN AND PELVIS  TECHNIQUE: Multidetector CT imaging through the chest, abdomen and pelvis was performed using the standard protocol during bolus administration of intravenous contrast. Multiplanar reconstructed images and MIPs were obtained and reviewed to evaluate the vascular anatomy.  CONTRAST:  161mL OMNIPAQUE IOHEXOL 350 MG/ML SOLN  COMPARISON:  CT of the abdomen and pelvis dated 05/09/2014  FINDINGS: CTA CHEST FINDINGS .  IMPRESSION: No CT evidence of pulmonary  embolism or aortic dissection.  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  No evidence of bowel obstruction or inflammation.  Normal appendix.   Electronically Signed   By: Anner Crete M.D.   On: 05/23/2015 00:49   Ct Cta Abd/pel W/cm &/or W/o Cm  05/23/2015   CLINICAL DATA:  44 year old male with chest pain.  EXAM: CT ANGIOGRAPHY CHEST, ABDOMEN AND PELVIS   IMPRESSION: No CT evidence of pulmonary embolism or aortic dissection.  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  No evidence of bowel obstruction or inflammation.  Normal appendix.   Electronically Signed   By: Anner Crete M.D.   On: 05/23/2015 00:49    IMPRESSION: Principal Problem:   Chest pain with moderate risk of acute coronary syndrome Active Problems:   Intractable nausea and vomiting   Smoker   RECOMMENDATION: Reviewed with Dr Tamala Julian, plan exercise Myoview today, OK to go  home from our standpoint if negative.  Time Spent Directly with Patient: 35 minutes  Erlene Quan [phone] beeper 05/23/2015, 10:52 AM

## 2015-05-23 NOTE — Progress Notes (Signed)
Patient ID: Tyler Bennett, male   DOB: [birth year], 44 y.o.   MRN: [id number]  [facility] PROGRESS NOTE  AHMARI DUERSON [id number] DOB: 1971/08/02 DOA: 05/22/2015 PCP: Elsie Stain, MD   Brief narrative:    Pt is 44 yo male who presented with main concern of several days duration of mid area chest pain, pressure like and intermittent. While in ED, pt noted to have HR in 40's and cardiology was consulted.   Assessment/Plan:    Principal Problem:   Chest pain with moderate risk of acute coronary syndrome - plan for stress test today and further recommendations based on study results - appreciate cardiology team following - continue to monitor on telemetry   Active Problems:   Intractable nausea and vomiting - fairly unremarkable CT abd, possibly THC induced - cessation of THC use discussed with pt    Smoker - smoking cessation provided   Code Status: Full.  Family Communication:  plan of care discussed with the patient Disposition Plan: Home when stable.   IV access:  Peripheral IV  Procedures and diagnostic studies:     Ct Cta Abd/pel W/cm &/or W/o Cm 05/23/2015  No CT evidence of pulmonary embolism or aortic dissection.  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  No evidence of bowel obstruction or inflammation.  Normal appendix.    Medical Consultants:  Cardiology   Other Consultants:  None  IAnti-Infectives:   None  Faye Ramsay, MD  [facility] Pager [phone]  If 7PM-7AM, please contact night-coverage [email or web address] Password [email or web address] 05/23/2015, 8:22 PM   LOS: 0 days   HPI/Subjective: No events overnight.   Objective: Filed Vitals:   05/23/15 0145 05/23/15 0223 05/23/15 0400 05/23/15 1630  BP: 117/51 140/77 130/64 118/61  Pulse: 46 48 51 44  Temp: 97.5 F (36.4 C) 97.8 F (36.6 C) 98.3 F (36.8 C) 98.7 F (37.1 C)  TempSrc: Oral Oral Oral Oral  Resp: 19 18  18 16   Height:      Weight:      SpO2: 97% 99% 98% 99%    Intake/Output Summary (Last 24 hours) at 05/23/15 2022 Last data filed at 05/23/15 1200  Gross per 24 hour  Intake 506.25 ml  Output      0 ml  Net 506.25 ml    Exam:   General:  Pt is alert, follows commands appropriately, not in acute distress  Cardiovascular: Regular rhythm, bradycardic, S1/S2, no murmurs, no rubs, no gallops  Respiratory: Clear to auscultation bilaterally, no wheezing, no crackles, no rhonchi  Abdomen: Soft, non tender, non distended, bowel sounds present, no guarding  Extremities: No edema, pulses DP and PT palpable bilaterally  Neuro: Grossly nonfocal  Data Reviewed: Basic Metabolic Panel:  Recent Labs Lab 05/22/15 2052 05/23/15 0433  NA 140 138  K 4.4 3.7  CL 105 108  CO2 23 23  GLUCOSE 192* 132*  BUN 12 11  CREATININE 0.99 0.91  CALCIUM 9.6 8.9  MG  --  1.9  PHOS  --  2.3*   Liver Function Tests:  Recent Labs Lab 05/22/15 2056 05/23/15 0433  AST 20 16  ALT 14* 12*  ALKPHOS 79 72  BILITOT 0.9 0.6  PROT 8.2* 6.8  ALBUMIN 4.8 4.2    Recent Labs Lab 05/22/15 2056 05/23/15 0433  LIPASE 12* 31   No results for input(s): AMMONIA in the last 168 hours. CBC:  Recent Labs Lab 05/22/15 2052 05/23/15 3149  WBC 14.4* 13.0*  HGB 15.1 13.3  HCT 44.2 39.9  MCV 93.6 93.7  PLT 278 231   Cardiac Enzymes:  Recent Labs Lab 05/22/15 2303 05/23/15 0433 05/23/15 1130 05/23/15 1710  TROPONINI <0.03 <0.03 <0.03 <0.03   Scheduled Meds: . aspirin EC  81 mg Oral Daily  . atorvastatin  80 mg Oral q1800  . enoxaparin (LOVENOX) injection  40 mg Subcutaneous Q24H  . metoCLOPramide (REGLAN) injection  5 mg Intravenous 4 times per day  . pantoprazole  40 mg Oral Q1200  . promethazine  12.5 mg Intravenous Once  . sodium chloride  3 mL Intravenous Q12H   Continuous Infusions:

## 2015-05-23 NOTE — Progress Notes (Signed)
Patient had CP and nausea prior to the treadmill starting.  He was given Zofran and it resolved.  He tolerated the treadmill very well.  Nathaniel Yaden, PAC

## 2015-05-24 ENCOUNTER — Inpatient Hospital Stay (HOSPITAL_COMMUNITY): Payer: Self-pay

## 2015-05-24 ENCOUNTER — Encounter (HOSPITAL_COMMUNITY)
Admission: EM | Disposition: A | Discharge status: Home / Self Care | Payer: Self-pay | Source: Home / Self Care | Attending: Internal Medicine

## 2015-05-24 DIAGNOSIS — R001 Bradycardia, unspecified: Secondary | ICD-10-CM | POA: Diagnosis present

## 2015-05-24 DIAGNOSIS — R079 Chest pain, unspecified: Secondary | ICD-10-CM

## 2015-05-24 DIAGNOSIS — R931 Abnormal findings on diagnostic imaging of heart and coronary circulation: Secondary | ICD-10-CM | POA: Diagnosis not present

## 2015-05-24 HISTORY — PX: CARDIAC CATHETERIZATION: SHX172

## 2015-05-24 LAB — CBC
HCT: 42.1 % (ref 39.0–52.0)
HEMOGLOBIN: 13.8 g/dL (ref 13.0–17.0)
MCH: 31.2 pg (ref 26.0–34.0)
MCHC: 32.8 g/dL (ref 30.0–36.0)
MCV: 95 fL (ref 78.0–100.0)
PLATELETS: 240 10*3/uL (ref 150–400)
RBC: 4.43 MIL/uL (ref 4.22–5.81)
RDW: 13.7 % (ref 11.5–15.5)
WBC: 10.6 10*3/uL — ABNORMAL HIGH (ref 4.0–10.5)

## 2015-05-24 LAB — BASIC METABOLIC PANEL
Anion gap: 6 (ref 5–15)
BUN: 11 mg/dL (ref 6–20)
CO2: 25 mmol/L (ref 22–32)
Calcium: 8.5 mg/dL — ABNORMAL LOW (ref 8.9–10.3)
Chloride: 106 mmol/L (ref 101–111)
Creatinine, Ser: 0.87 mg/dL (ref 0.61–1.24)
GFR calc Af Amer: >60 mL/min (ref >60)
GLUCOSE: 104 mg/dL — AB (ref 65–99)
POTASSIUM: 3.5 mmol/L (ref 3.5–5.1)
SODIUM: 137 mmol/L (ref 135–145)

## 2015-05-24 SURGERY — LEFT HEART CATH AND CORONARY ANGIOGRAPHY
Anesthesia: LOCAL

## 2015-05-24 MED ORDER — LIDOCAINE HCL (PF) 1 % IJ SOLN
INTRAMUSCULAR | Status: AC
  Filled 2015-05-24: qty 30

## 2015-05-24 MED ORDER — NITROGLYCERIN 1 MG/10 ML FOR IR/CATH LAB
INTRA_ARTERIAL | Status: DC | PRN
  Administered 2015-05-24: 17:00:00

## 2015-05-24 MED ORDER — FENTANYL CITRATE (PF) 100 MCG/2ML IJ SOLN
INTRAMUSCULAR | Status: AC
  Filled 2015-05-24: qty 2

## 2015-05-24 MED ORDER — MIDAZOLAM HCL 2 MG/2ML IJ SOLN
INTRAMUSCULAR | Status: AC
  Filled 2015-05-24: qty 2

## 2015-05-24 MED ORDER — VERAPAMIL HCL 2.5 MG/ML IV SOLN
INTRAVENOUS | Status: AC
  Filled 2015-05-24: qty 2

## 2015-05-24 MED ORDER — MIDAZOLAM HCL 2 MG/2ML IJ SOLN
INTRAMUSCULAR | Status: DC | PRN
  Administered 2015-05-24: 2 mg via INTRAVENOUS

## 2015-05-24 MED ORDER — FENTANYL CITRATE (PF) 100 MCG/2ML IJ SOLN
INTRAMUSCULAR | Status: DC | PRN
  Administered 2015-05-24: 25 ug via INTRAVENOUS

## 2015-05-24 MED ORDER — LORAZEPAM 0.5 MG PO TABS
0.5000 mg | ORAL_TABLET | Freq: Once | ORAL | Status: AC
  Administered 2015-05-24: 22:00:00 0.5 mg via ORAL
  Filled 2015-05-24: qty 1

## 2015-05-24 MED ORDER — HEPARIN (PORCINE) IN NACL 2-0.9 UNIT/ML-% IJ SOLN
INTRAMUSCULAR | Status: AC
  Filled 2015-05-24: qty 1000

## 2015-05-24 MED ORDER — NITROGLYCERIN 1 MG/10 ML FOR IR/CATH LAB
INTRA_ARTERIAL | Status: AC
  Filled 2015-05-24: qty 10

## 2015-05-24 MED ORDER — SODIUM CHLORIDE 0.9 % IJ SOLN: 3.0000 mL | Freq: Two times a day (BID) | INTRAMUSCULAR | Status: DC

## 2015-05-24 MED ORDER — VERAPAMIL HCL 2.5 MG/ML IV SOLN
INTRAVENOUS | Status: DC | PRN
  Administered 2015-05-24: 17:00:00 via INTRA_ARTERIAL

## 2015-05-24 MED ORDER — HEPARIN SODIUM (PORCINE) 1000 UNIT/ML IJ SOLN
INTRAMUSCULAR | Status: AC
  Filled 2015-05-24: qty 1

## 2015-05-24 MED ORDER — IOHEXOL 350 MG/ML SOLN
INTRAVENOUS | Status: DC | PRN
  Administered 2015-05-24: 50 mL via INTRA_ARTERIAL

## 2015-05-24 MED ORDER — SODIUM CHLORIDE 0.9 % IV SOLN
INTRAVENOUS | Status: DC
Start: 2015-05-24 — End: 2015-05-24

## 2015-05-24 MED ORDER — HEPARIN SODIUM (PORCINE) 1000 UNIT/ML IJ SOLN
INTRAMUSCULAR | Status: DC | PRN
  Administered 2015-05-24: 4000 [IU] via INTRAVENOUS

## 2015-05-24 MED ORDER — ENOXAPARIN SODIUM 40 MG/0.4ML ~~LOC~~ SOLN
40.0000 mg | SUBCUTANEOUS | Status: DC
  Administered 2015-05-25: 40 mg via SUBCUTANEOUS
  Filled 2015-05-24 (×2): qty 0.4

## 2015-05-24 MED ORDER — SODIUM CHLORIDE 0.9 % IV SOLN
INTRAVENOUS | Status: AC
  Administered 2015-05-24: 18:00:00 via INTRAVENOUS

## 2015-05-24 MED ORDER — SODIUM CHLORIDE 0.9 % IV SOLN
INTRAVENOUS | Status: DC
  Administered 2015-05-24: 09:00:00 via INTRAVENOUS

## 2015-05-24 MED ORDER — SODIUM CHLORIDE 0.9 % IV SOLN: 250.0000 mL | INTRAVENOUS | Status: DC | PRN

## 2015-05-24 MED ORDER — SODIUM CHLORIDE 0.9 % IJ SOLN: 3.0000 mL | INTRAMUSCULAR | Status: DC | PRN

## 2015-05-24 SURGICAL SUPPLY — 9 items
CATH INFINITI 5 FR JL3.5 (CATHETERS) ×2 IMPLANT
CATH INFINITI JR4 5F (CATHETERS) ×2 IMPLANT
DEVICE RAD COMP TR BAND LRG (VASCULAR PRODUCTS) ×2 IMPLANT
GLIDESHEATH SLEND SS 6F .021 (SHEATH) ×2 IMPLANT
KIT HEART LEFT (KITS) ×2 IMPLANT
PACK CARDIAC CATHETERIZATION (CUSTOM PROCEDURE TRAY) ×2 IMPLANT
TRANSDUCER W/STOPCOCK (MISCELLANEOUS) ×2 IMPLANT
TUBING CIL FLEX 10 FLL-RA (TUBING) ×2 IMPLANT
WIRE SAFE-T 1.5MM-J .035X260CM (WIRE) ×2 IMPLANT

## 2015-05-24 NOTE — Progress Notes (Signed)
Patient is being transferred to [facility] for cardiac cath via [facility], pt stable with no c/o pain at this time, IVFs infusing, unit telemetry monitor removed prior to patient transfer

## 2015-05-24 NOTE — Interval H&P Note (Signed)
History and Physical Interval Note:  05/24/2015 4:55 PM  Tyler Bennett  has presented today for surgery, with the diagnosis of c/p  The various methods of treatment have been discussed with the patient and family. After consideration of risks, benefits and other options for treatment, the patient has consented to  Procedure(s): Left Heart Cath and Coronary Angiography (N/A) as a surgical intervention .  The patient's history has been reviewed, patient examined, no change in status, stable for surgery.  I have reviewed the patient's chart and labs.  Questions were answered to the patient's satisfaction.     Sherren Mocha

## 2015-05-24 NOTE — Progress Notes (Signed)
  Echocardiogram 2D Echocardiogram has been performed.  Tyler Bennett M 05/24/2015, 11:06 AM

## 2015-05-24 NOTE — Progress Notes (Signed)
Subjective:  He continues to have have nausea and dry heaves  Objective:  Vital Signs in the last 24 hours: Temp:  [98.1 F (36.7 C)-99 F (37.2 C)] 98.1 F (36.7 C) (07/08 0551) Pulse Rate:  [41-152] 41 (07/08 0551) Resp:  [16-18] 18 (07/08 0551) BP: (118-180)/(48-70) 137/70 mmHg (07/08 0551) SpO2:  [98 %-99 %] 98 % (07/08 0551)  Intake/Output from previous day:  Intake/Output Summary (Last 24 hours) at 05/24/15 0832 Last data filed at 05/24/15 6962  Gross per 24 hour  Intake    240 ml  Output      0 ml  Net    240 ml    Physical Exam: General appearance: alert, cooperative and no distress Lungs: scattered expiratory wheezing Heart: regular rate and rhythm Abdomen: soft, non-tender; bowel sounds normal; no masses,  no organomegaly Skin: cool and dry Neurologic: Grossly normal   Rate: 40  Rhythm: normal sinus rhythm and sinus bradycardia  Lab Results:  Recent Labs  05/23/15 0433 05/24/15 0442  WBC 13.0* 10.6*  HGB 13.3 13.8  PLT 231 240    Recent Labs  05/23/15 0433 05/24/15 0442  NA 138 137  K 3.7 3.5  CL 108 106  CO2 23 25  GLUCOSE 132* 104*  BUN 11 11  CREATININE 0.91 0.87    Recent Labs  05/23/15 1130 05/23/15 1710  TROPONINI <0.03 <0.03   No results for input(s): INR in the last 72 hours.  Scheduled Meds: . aspirin EC  81 mg Oral Daily  . atorvastatin  80 mg Oral q1800  . enoxaparin (LOVENOX) injection  40 mg Subcutaneous Q24H  . metoCLOPramide (REGLAN) injection  5 mg Intravenous 4 times per day  . pantoprazole  40 mg Oral Q1200  . promethazine  12.5 mg Intravenous Once  . sodium chloride  3 mL Intravenous Q12H   Continuous Infusions:  PRN Meds:.acetaminophen **OR** acetaminophen, HYDROcodone-acetaminophen, morphine injection, ondansetron **OR** ondansetron (ZOFRAN) IV   Imaging: Imaging results have been reviewed  Cardiac Studies: Myoview 05/23/15  There was no ST segment deviation noted during stress.  No T wave  inversion was noted during stress.  Defect 1: There is a small defect of mild severity present in the apical septal location.  Defect 2: There is a small defect of mild severity present in the basal inferoseptal location.  This is a low risk study.  Findings consistent with ischemia.  Nuclear stress EF: 54%.  Assessment/Plan:  44 y.o. male with a PMH significant only for smoking, presented to [facility] ED 05/22/15 after developing SSCP. He had been working on roof and went into his truck. When in the [facility] he developed "pulsing" SSCP with arm numbness and nausea and vomiting. He came to the ED secondary to persistent nausea and vomiting. EKG with NSST changes, Troponin negative x 3. Myoview 05/23/15 low risk but abnormal. Telemetry shows NSR, SB.  Principal Problem:   Chest pain with moderate risk of acute coronary syndrome Active Problems:   Abnormal nuclear cardiac imaging test   Intractable nausea and vomiting   Bradycardia   Smoker   PLAN: Reviewed Myoview results with Dr Tamala Julian and Dr Doyle Askew. Pt will be transferred to [facility] for diagnostic cath. Previously I thought he was asymptomatic from his bradycardia but he now admits to dizziness and "greying out" when he closes his eyes. Will keep on telemetry (no shower yet).   Kerin Ransom PA-C 05/24/2015, 8:32 AM [phone]  The patient understands that risks included but are  not limited to stroke (1 in 1000), death (1 in 55), kidney failure [usually temporary] (1 in 500), bleeding (1 in 200), allergic reaction [possibly serious] (1 in 200).  The patient understands and agrees to proceed.

## 2015-05-24 NOTE — H&P (View-Only) (Signed)
Please see the note by [facility].

## 2015-05-24 NOTE — Progress Notes (Signed)
Chaplain provided support with Jeneen Rinks and sister at bedside.   Support around upcoming transfer to [facility] and procedure.  Pt's aunt recently admitted to hospital in [place] for heart attack / catheterization.  Maleko and sister requested prayers and that spiritual care follow up at [facility].    Shared prayers with family at bedside and will notify chaplains at [facility] of pt's procedure.   Completed advance directive packet with Jeneen Rinks.  Notarized and copy placed in chart and with medical records to be scanned into EPIC.  Patient given original and copy.       05/24/15 1100  Clinical Encounter Type  Visited With Patient and family together  Visit Type Initial;Psychological support;Spiritual support;Other (Comment) (Advance Directive)  Referral From Social work  Consult/Referral To Chaplain  Recommendations Follow up for continued spiritual / emotional support  Spiritual Encounters  Spiritual Needs Prayer;Emotional  Stress Factors  Patient Stress Factors Major life changes  Advance Directives (For Healthcare)  Does patient have an advance directive? Yes  Type of Paramedic of [place];Living will  Copy of advanced directive(s) in chart? Yes     Creta Levin MDiv

## 2015-05-24 NOTE — Progress Notes (Signed)
Please see the note by [facility].

## 2015-05-24 NOTE — Progress Notes (Signed)
Patient ID: Tyler Bennett, male   DOB: Jun 28, 1971, 44 y.o.   MRN: [id number]  [facility] PROGRESS NOTE  Tyler Bennett [id number] DOB: [birth year] DOA: 05/22/2015 PCP: Elsie Stain, MD   Brief narrative:    Pt is 44 yo male who presented with main concern of several days duration of mid area chest pain, pressure like and intermittent. Episodes have been occasionally but not consistently associated with dizziness and lightheadedness and occur randomly with exertion and at rest. While in ED, pt noted to have HR in 40's and cardiology was consulted.   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 Bradycardic events - may need neurology consult if cardiac cath unrevealing and if pt still symptomatic     Smoker - smoking cessation provided    Leukocytosis - unclear etiology and with N/V may be related to gastroenteritis  - no signs of an infectious etiology based on CXR, no urinary concerns, CT abd with no specific etiology to explain symptoms  - ABX have not been started and pt has been afebrile - WBC is trending down  - will repeat again in AM    Hypophosphatemia - supplement and repeat Phos in AM  Code Status: Full.  Family Communication:  plan of care discussed with the patient and family at bedside  Disposition  Plan: Home when stable.   IV access:  Peripheral IV  Procedures and diagnostic studies:     Ct Cta Abd/pel W/cm &/or W/o Cm 05/23/2015  No CT evidence of pulmonary embolism or aortic dissection.  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  No evidence of bowel obstruction or inflammation.  Normal appendix.    Medical Consultants:  Cardiology   Other Consultants:  None  IAnti-Infectives:   None  Faye Ramsay, MD  [facility] Pager [phone]  If 7PM-7AM, please contact night-coverage [email or web address] Password [email or web address] 05/24/2015, 9:37 AM   LOS: 1 day   HPI/Subjective: No events overnight. Persistent dizziness and lightheadedness while resting.   Objective: Filed Vitals:   05/23/15 0400 05/23/15 1630 05/23/15 2111 05/24/15 0551  BP: 130/64 118/61 124/70 137/70  Pulse: 51 44 72 41  Temp: 98.3 F (36.8 C) 98.7 F (37.1 C) 99 F (37.2 C) 98.1 F (36.7 C)  TempSrc: Oral Oral Oral Oral  Resp: 18 16 16 18   Height:      Weight:      SpO2: 98% 99% 99% 98%    Intake/Output Summary (Last 24 hours) at 05/24/15 2831 Last data filed at 05/24/15 5176  Gross per 24 hour  Intake    240 ml  Output      0 ml  Net    240 ml    Exam:   General:  Pt is alert, follows commands appropriately, not in acute distress  Cardiovascular: Regular rhythm, bradycardic, S1/S2,  no murmurs, no rubs, no gallops  Respiratory: Clear to auscultation bilaterally, no wheezing, no crackles, no rhonchi  Abdomen: Soft, non tender, non distended, bowel sounds present, no guarding  Extremities: No edema, pulses DP and PT palpable bilaterally  Neuro: Grossly nonfocal  Data Reviewed: Basic Metabolic Panel:  Recent Labs Lab 05/22/15 2052 05/23/15 0433 05/24/15 0442  NA 140 138 137  K 4.4 3.7 3.5  CL 105 108 106  CO2 23 23 25   GLUCOSE 192* 132* 104*  BUN 12 11 11   CREATININE 0.99 0.91 0.87  CALCIUM 9.6 8.9 8.5*   MG  --  1.9  --   PHOS  --  2.3*  --    Liver Function Tests:  Recent Labs Lab 05/22/15 2056 05/23/15 0433  AST 20 16  ALT 14* 12*  ALKPHOS 79 72  BILITOT 0.9 0.6  PROT 8.2* 6.8  ALBUMIN 4.8 4.2    Recent Labs Lab 05/22/15 2056 05/23/15 0433  LIPASE 12* 31   CBC:  Recent Labs Lab 05/22/15 2052 05/23/15 0433 05/24/15 0442  WBC 14.4* 13.0* 10.6*  HGB 15.1 13.3 13.8  HCT 44.2 39.9 42.1  MCV 93.6 93.7 95.0  PLT 278 231 240   Cardiac Enzymes:  Recent Labs Lab 05/22/15 2303 05/23/15 0433 05/23/15 1130 05/23/15 1710  TROPONINI <0.03 <0.03 <0.03 <0.03   Scheduled Meds: . aspirin EC  81 mg Oral Daily  . atorvastatin  80 mg Oral q1800  . [START ON 05/25/2015] enoxaparin (LOVENOX) injection  40 mg Subcutaneous Q24H  . metoCLOPramide (REGLAN) injection  5 mg Intravenous 4 times per day  . pantoprazole  40 mg Oral Q1200  . promethazine  12.5 mg Intravenous Once  . sodium chloride  3 mL Intravenous Q12H   Continuous Infusions: . sodium chloride 75 mL/hr at 05/24/15 0907

## 2015-05-25 LAB — CBC
HEMATOCRIT: 43.2 % (ref 39.0–52.0)
HEMOGLOBIN: 15 g/dL (ref 13.0–17.0)
MCH: 31.9 pg (ref 26.0–34.0)
MCHC: 34.7 g/dL (ref 30.0–36.0)
MCV: 91.9 fL (ref 78.0–100.0)
Platelets: 241 10*3/uL (ref 150–400)
RBC: 4.7 MIL/uL (ref 4.22–5.81)
RDW: 13.2 % (ref 11.5–15.5)
WBC: 11.7 10*3/uL — ABNORMAL HIGH (ref 4.0–10.5)

## 2015-05-25 LAB — BASIC METABOLIC PANEL
ANION GAP: 8 (ref 5–15)
BUN: 8 mg/dL (ref 6–20)
CHLORIDE: 103 mmol/L (ref 101–111)
CO2: 24 mmol/L (ref 22–32)
Calcium: 8.6 mg/dL — ABNORMAL LOW (ref 8.9–10.3)
Creatinine, Ser: 0.95 mg/dL (ref 0.61–1.24)
GFR calc non Af Amer: >60 mL/min (ref >60)
Glucose, Bld: 135 mg/dL — ABNORMAL HIGH (ref 65–99)
Potassium: 3.5 mmol/L (ref 3.5–5.1)
Sodium: 135 mmol/L (ref 135–145)

## 2015-05-25 LAB — PHOSPHORUS: Phosphorus: 2.2 mg/dL — ABNORMAL LOW (ref 2.5–4.6)

## 2015-05-25 MED ORDER — PROMETHAZINE HCL 25 MG RE SUPP: 25.0000 mg | Freq: Four times a day (QID) | RECTAL | Status: DC | PRN

## 2015-05-25 MED ORDER — K PHOS MONO-SOD PHOS DI & MONO 155-852-130 MG PO TABS
500.0000 mg | ORAL_TABLET | Freq: Once | ORAL | Status: DC
  Filled 2015-05-25: qty 2

## 2015-05-25 MED ORDER — PANTOPRAZOLE SODIUM 40 MG PO TBEC: 40.0000 mg | DELAYED_RELEASE_TABLET | Freq: Every day | ORAL | Status: DC

## 2015-05-25 NOTE — Discharge Summary (Addendum)
Tyler Bennett, is a 44 y.o. male  DOB May 05, 1971  MRN [id number].  Admission date:  05/22/2015  Admitting Physician  Toy Baker, MD  Discharge Date:  05/25/2015   Primary MD  Elsie Stain, MD  Recommendations for primary care physician for things to follow:   Check CBC, BMP in a week.   Admission Diagnosis  Chest pain, unspecified chest pain type [R07.9]   Discharge Diagnosis  Chest pain, unspecified chest pain type [R07.9]     Principal Problem:   Chest pain with moderate risk of acute coronary syndrome Active Problems:   Intractable nausea and vomiting   Smoker   Abnormal nuclear cardiac imaging test   Bradycardia      History reviewed. No pertinent past medical history.  History reviewed. No pertinent past surgical history.     HPI  from the history and physical done on the day of admission:     Tyler Bennett is a 44 y.o. male  has no past medical history on file.   Presented with chest pain since 9 am after working on the roof. Decribes as squeezing sensation in epigastric area. Worse with eating or drinking. Every time he tries to eat he vomits. CXR was unremarkable. WBC elevated to 14. He reports some chills. Troponin is unremarkable. She attempted to take aspirin but vomited. Pain improved with nitro for 10 minutes but since has restarted. Describes pain as repeatedly squeezing. Given pulsatile nature of abdominal chest pain CT angiogram of abdomen and chest has been ordered which showed no no evidence of aortic dissection no PE. It did show some possible hepatic hemangiomas but otherwise unremarkable. Patient requested something to drink and was not able to tolerate fluids. Patient prompted lately vomited after drinking orange juice and his chest pain has resumed. At this point we'll  admit for rehydration and supportive therapy for persistent nausea and vomiting. Serial EKG was unchanged. Troponin negative 2. Patient wasn't noted to be somewhat bradycardic down to 46. Hospitalist was called for admission for chest pain/epigastric pain with intractable nausea and vomiting.    Hospital Course:    1. Atypical noncardiac chest pain. Seen by cardiology, Myoview caution about for reversible ischemia, echogram stable with preserved EF of around 55-60%, seen by cardiology underwent left heart catheterization which was unremarkable. Cleared by cardiology for discharge. Per cardiology stop aspirin and statin. Mild sinus bradycardia reviewed by cardiology, again cardiology cleared him for discharge, blood pressure stable patient is symptomatically with heart rates of 45 at rest upon ambulating rises to mid 50s. TSH was stable.    2. Smoking. Counseled to quit.   3. Mild gastroenteritis with leukocytosis. Resolved.   4. Mildly low phosphorous. Replaced.     Discharge Condition: Stable  Follow UP  Follow-up Information    Follow up with Sinclair Grooms, MD.   Specialty:  Cardiology   Why:  As needed   Contact information:   [address] [place] [address] [phone]  Follow up with Elsie Stain, MD. Schedule an appointment as soon as possible for a visit in 1 week.   Specialty:  Family Medicine   Contact information:   [place] [address] [phone]        Consults obtained - Cards  Diet and Activity recommendation: See Discharge Instructions below  Discharge Instructions           Discharge Instructions    Diet - low sodium heart healthy    Complete by:  As directed      Discharge instructions    Complete by:  As directed   Follow with Primary MD Elsie Stain, MD in 7 days   Get CBC, CMP, 2 view Chest X ray checked  by Primary MD next visit.    Activity: As tolerated with Full fall  precautions use walker/cane & assistance as needed   Disposition Home     Diet: Heart Healthy    For Heart failure patients - Check your Weight same time everyday, if you gain over 2 pounds, or you develop in leg swelling, experience more shortness of breath or chest pain, call your Primary MD immediately. Follow Cardiac Low Salt Diet and 1.5 lit/day fluid restriction.   On your next visit with your primary care physician please Get Medicines reviewed and adjusted.   Please request your Prim.MD to go over all Hospital Tests and Procedure/Radiological results at the follow up, please get all Hospital records sent to your Prim MD by signing hospital release before you go home.   If you experience worsening of your admission symptoms, develop shortness of breath, life threatening emergency, suicidal or homicidal thoughts you must seek medical attention immediately by calling 911 or calling your MD immediately  if symptoms less severe.  You Must read complete instructions/literature along with all the possible adverse reactions/side effects for all the Medicines you take and that have been prescribed to you. Take any new Medicines after you have completely understood and accpet all the possible adverse reactions/side effects.   Do not drive, operating heavy machinery, perform activities at heights, swimming or participation in water activities or provide baby sitting services if your were admitted for syncope or siezures until you have seen by Primary MD or a Neurologist and advised to do so again.  Do not drive when taking Pain medications.    Do not take more than prescribed Pain, Sleep and Anxiety Medications  Special Instructions: If you have smoked or chewed Tobacco  in the last 2 yrs please stop smoking, stop any regular Alcohol  and or any Recreational drug use.  Wear Seat belts while driving.   Please note  You were cared for by a hospitalist during your hospital stay. If you  have any questions about your discharge medications or the care you received while you were in the hospital after you are discharged, you can call the unit and asked to speak with the hospitalist on call if the hospitalist that took care of you is not available. Once you are discharged, your primary care physician will handle any further medical issues. Please note that NO REFILLS for any discharge medications will be authorized once you are discharged, as it is imperative that you return to your primary care physician (or establish a relationship with a primary care physician if you do not have one) for your aftercare needs so that they can reassess your need for medications and monitor your lab values.  Increase activity slowly    Complete by:  As directed              Discharge Medications       Medication List    STOP taking these medications        AMOXICILLIN PO     aspirin 81 MG chewable tablet      TAKE these medications        anti-nausea solution  Take 15 mLs by mouth every 15 (fifteen) minutes as needed for nausea or vomiting.     ibuprofen 200 MG tablet  Commonly known as:  ADVIL,MOTRIN  Take 400 mg by mouth every 6 (six) hours as needed for moderate pain.     pantoprazole 40 MG tablet  Commonly known as:  PROTONIX  Take 1 tablet (40 mg total) by mouth daily at 12 noon. Can't substitute to any covered brand     promethazine 25 MG suppository  Commonly known as:  PHENERGAN  Place 1 suppository (25 mg total) rectally every 6 (six) hours as needed for nausea or vomiting.        Major procedures and Radiology Reports - PLEASE review detailed and final reports for all details, in brief -    Left heart cath -    Final conclusions: Minor nonobstructive coronary artery disease with minimal irregularities in the right coronary artery. Suspect noncardiac symptoms.       TTE  - Left ventricle: The cavity size was normal. Wall thickness was normal.  Systolic function was normal. The estimated ejection fraction was in the range of 60% to 65%. Wall motion was normal; there were no regional wall motion abnormalities. Left ventricular diastolic function parameters were normal. - Right atrium: The atrium was mildly dilated.    Nm Myocar Multi W/spect W/wall Motion / Ef  05/23/2015    There was no ST segment deviation noted during stress.  No T wave inversion was noted during stress.  Defect 1: There is a small defect of mild severity present in the apical  septal location.  Defect 2: There is a small defect of mild severity present in the basal  inferoseptal location.  This is a low risk study.  Findings consistent with ischemia.  Nuclear stress EF: 54%.   Low risk stress nuclear study with two small areas of mild reversible  ischemia (apicoseptal and basal inferoseptal) and normal left ventricular  regional and global systolic function.    Dg Chest Port 1 View  05/22/2015   CLINICAL DATA:  Mid chest pain. Shortness of breath. Productive cough. Vomiting.  EXAM: PORTABLE CHEST - 1 VIEW  COMPARISON:  05/09/2014  FINDINGS: The heart size and mediastinal contours are within normal limits. Both lungs are clear. The visualized skeletal structures are unremarkable.  IMPRESSION: No active disease.   Electronically Signed   By: Earle Gell M.D.   On: 05/22/2015 20:50   Ct Angio Chest Aorta W/cm &/or Wo/cm  05/23/2015   CLINICAL DATA:  44 year old male with chest pain.  EXAM: CT ANGIOGRAPHY CHEST, ABDOMEN AND PELVIS  TECHNIQUE: Multidetector CT imaging through the chest, abdomen and pelvis was performed using the standard protocol during bolus administration of intravenous contrast. Multiplanar reconstructed images and MIPs were obtained and reviewed to evaluate the vascular anatomy.  CONTRAST:  162mL OMNIPAQUE IOHEXOL 350 MG/ML SOLN  COMPARISON:  CT of the abdomen and pelvis dated 05/09/2014  FINDINGS: CTA CHEST FINDINGS  The lungs are clear. No  pleural effusion. The central airways  are patent.  The thoracic aorta appears unremarkable. No CT evidence of pulmonary embolism. No lymphadenopathy. No cardiomegaly or pericardial effusion. The mediastinum is unremarkable. The osseous structures appear unremarkable. No acute fracture.  Review of the MIP images confirms the above findings.  CTA ABDOMEN AND PELVIS FINDINGS  No intra-abdominal free air or free fluid.  Subcentimeter right hepatic subcapsular enhancing focus (series 5, image 3 may represent a small vessel or a flash filling hemangioma or small portal venous shunting. The previously described hypodense lesion in the posterior right lobe of the liver is faintly visualized on the current study and not well characterized. This lesion was previously characterized as hemangioma. A 1 cm enhancing focus is noted in the left lobe of the liver inferiorly (series 5, image 82) which is not well characterized but may represent flash filling hemangioma or portal venous shunting. MRI may provide better characterization if clinically indicated. The gallbladder, pancreas, spleen, adrenal glands, kidneys, visualized ureters, and urinary bladder anterior unremarkable. The prostate gland is unremarkable by size criteria.  Top-normal loop of small bowel noted in the left upper abdomen. There is no evidence of bowel obstruction or inflammation. The appendix appears unremarkable.  Mild atherosclerotic calcification of the aorta. The origins of the celiac axis, SMA, IMA as well as the origins of the renal arteries are patent. No lymphadenopathy.  Small fat containing umbilical hernia. Mild degenerative changes of the spine. No acute fracture.  Review of the MIP images confirms the above findings.  IMPRESSION: No CT evidence of pulmonary embolism or aortic dissection.  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  No evidence of bowel obstruction  or inflammation.  Normal appendix.   Electronically Signed   By: Anner Crete M.D.   On: 05/23/2015 00:49   Ct Cta Abd/pel W/cm &/or W/o Cm  05/23/2015   CLINICAL DATA:  44 year old male with chest pain.  EXAM: CT ANGIOGRAPHY CHEST, ABDOMEN AND PELVIS  TECHNIQUE: Multidetector CT imaging through the chest, abdomen and pelvis was performed using the standard protocol during bolus administration of intravenous contrast. Multiplanar reconstructed images and MIPs were obtained and reviewed to evaluate the vascular anatomy.  CONTRAST:  12mL OMNIPAQUE IOHEXOL 350 MG/ML SOLN  COMPARISON:  CT of the abdomen and pelvis dated 05/09/2014  FINDINGS: CTA CHEST FINDINGS  The lungs are clear. No pleural effusion. The central airways are patent.  The thoracic aorta appears unremarkable. No CT evidence of pulmonary embolism. No lymphadenopathy. No cardiomegaly or pericardial effusion. The mediastinum is unremarkable. The osseous structures appear unremarkable. No acute fracture.  Review of the MIP images confirms the above findings.  CTA ABDOMEN AND PELVIS FINDINGS  No intra-abdominal free air or free fluid.  Subcentimeter right hepatic subcapsular enhancing focus (series 5, image 3 may represent a small vessel or a flash filling hemangioma or small portal venous shunting. The previously described hypodense lesion in the posterior right lobe of the liver is faintly visualized on the current study and not well characterized. This lesion was previously characterized as hemangioma. A 1 cm enhancing focus is noted in the left lobe of the liver inferiorly (series 5, image 82) which is not well characterized but may represent flash filling hemangioma or portal venous shunting. MRI may provide better characterization if clinically indicated. The gallbladder, pancreas, spleen, adrenal glands, kidneys, visualized ureters, and urinary bladder anterior unremarkable. The prostate gland is unremarkable by size criteria.  Top-normal loop  of small bowel noted in  the left upper abdomen. There is no evidence of bowel obstruction or inflammation. The appendix appears unremarkable.  Mild atherosclerotic calcification of the aorta. The origins of the celiac axis, SMA, IMA as well as the origins of the renal arteries are patent. No lymphadenopathy.  Small fat containing umbilical hernia. Mild degenerative changes of the spine. No acute fracture.  Review of the MIP images confirms the above findings.  IMPRESSION: No CT evidence of pulmonary embolism or aortic dissection.  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  No evidence of bowel obstruction or inflammation.  Normal appendix.   Electronically Signed   By: Anner Crete M.D.   On: 05/23/2015 00:49    Micro Results      No results found for this or any previous visit (from the past 240 hour(s)).     Today   Subjective    Tyler Bennett today has no headache,no chest abdominal pain,no new weakness tingling or numbness, feels much better wants to go home today.     Objective   Blood pressure 137/78, pulse 45, temperature 97.6 F (36.4 C), temperature source Oral, resp. rate 18, height 5\' 8"  (1.727 m), weight 88 kg (194 lb 0.1 oz), SpO2 94 %.   Intake/Output Summary (Last 24 hours) at 05/25/15 0937 Last data filed at 05/25/15 0748  Gross per 24 hour  Intake  792.5 ml  Output   1800 ml  Net -1007.5 ml    Exam Awake Alert, Oriented x 3, No new F.N deficits, Normal affect [place].AT,PERRAL Supple Neck,No JVD, No cervical lymphadenopathy appriciated.  Symmetrical Chest wall movement, Good air movement bilaterally, CTAB RRR,No Gallops,Rubs or new Murmurs, No Parasternal Heave +ve B.Sounds, Abd Soft, Non tender, No organomegaly appriciated, No rebound -guarding or rigidity. No Cyanosis, Clubbing or edema, No new Rash or bruise     Data Review   CBC w Diff:  Lab Results  Component Value  Date   WBC 11.7* 05/25/2015   WBC 10.0 05/10/2014   HGB 15.0 05/25/2015   HGB 13.6 05/10/2014   HCT 43.2 05/25/2015   HCT 39.8* 05/10/2014   PLT 241 05/25/2015   PLT 210 05/10/2014   LYMPHOPCT 25.4 05/10/2014   MONOPCT 9.6 05/10/2014   EOSPCT 1.5 05/10/2014   BASOPCT 0.2 05/10/2014    CMP:  Lab Results  Component Value Date   NA 135 05/25/2015   NA 140 05/10/2014   K 3.5 05/25/2015   K 3.6 05/10/2014   CL 103 05/25/2015   CL 110* 05/10/2014   CO2 24 05/25/2015   CO2 24 05/10/2014   BUN 8 05/25/2015   BUN 8 05/10/2014   CREATININE 0.95 05/25/2015   CREATININE 1.07 05/10/2014   PROT 6.8 05/23/2015   PROT 6.0* 05/10/2014   ALBUMIN 4.2 05/23/2015   ALBUMIN 2.9* 05/10/2014   BILITOT 0.6 05/23/2015   ALKPHOS 72 05/23/2015   ALKPHOS 71 05/10/2014   AST 16 05/23/2015   AST 16 05/10/2014   ALT 12* 05/23/2015   ALT 11* 05/10/2014  .  Lab Results  Component Value Date   TSH 0.473 05/23/2015    Total Time in preparing paper work, data evaluation and todays exam - 35 minutes  Thurnell Lose M.D on 05/25/2015 at 9:37 AM  [facility]   Office  [phone]

## 2015-05-25 NOTE — Progress Notes (Signed)
    Subjective:  Nausea resolved  Objective:  Vital Signs in the last 24 hours: Temp:  [97.4 F (36.3 C)-98.6 F (37 C)] 97.6 F (36.4 C) (07/09 0745) Pulse Rate:  [44-58] 45 (07/09 0745) Resp:  [0-21] 18 (07/09 0745) BP: (110-166)/(63-89) 137/78 mmHg (07/09 0745) SpO2:  [94 %-100 %] 94 % (07/09 0745) Weight:  [194 lb 0.1 oz (88 kg)] 194 lb 0.1 oz (88 kg) (07/09 0003)  Intake/Output from previous day:  Intake/Output Summary (Last 24 hours) at 05/25/15 0748 Last data filed at 05/25/15 0746  Gross per 24 hour  Intake  792.5 ml  Output   1400 ml  Net -607.5 ml    Physical Exam: General appearance: alert, cooperative and no distress Lungs: scattered expiratory wheezing Heart: regular rate and rhythm Abdomen: soft, non-tender; bowel sounds normal; no masses,  no organomegaly Skin: cool and dry Neurologic: Grossly normal Rt radial site without hematoma   Rate: 40  Rhythm: normal sinus rhythm and sinus bradycardia  Lab Results:  Recent Labs  05/24/15 0442 05/25/15 0253  WBC 10.6* 11.7*  HGB 13.8 15.0  PLT 240 241    Recent Labs  05/24/15 0442 05/25/15 0253  NA 137 135  K 3.5 3.5  CL 106 103  CO2 25 24  GLUCOSE 104* 135*  BUN 11 8  CREATININE 0.87 0.95    Recent Labs  05/23/15 1130 05/23/15 1710  TROPONINI <0.03 <0.03   No results for input(s): INR in the last 72 hours.  Scheduled Meds: . aspirin EC  81 mg Oral Daily  . atorvastatin  80 mg Oral q1800  . enoxaparin (LOVENOX) injection  40 mg Subcutaneous Q24H  . metoCLOPramide (REGLAN) injection  5 mg Intravenous 4 times per day  . pantoprazole  40 mg Oral Q1200  . promethazine  12.5 mg Intravenous Once   Continuous Infusions: . sodium chloride 75 mL/hr at 05/24/15 0907   PRN Meds:.acetaminophen **OR** acetaminophen, HYDROcodone-acetaminophen, morphine injection, ondansetron **OR** ondansetron (ZOFRAN) IV   Imaging: Imaging results have been reviewed  Cardiac Studies: Myoview  05/23/15  There was no ST segment deviation noted during stress.  No T wave inversion was noted during stress.  Defect 1: There is a small defect of mild severity present in the apical septal location.  Defect 2: There is a small defect of mild severity present in the basal inferoseptal location.  This is a low risk study.  Findings consistent with ischemia.  Nuclear stress EF: 54%.  Assessment/Plan:  44 y.o. male with a PMH significant only for smoking, presented to [facility] ED 05/22/15 after developing SSCP. He had been working on roof and went into his truck. When in the [facility] he developed "pulsing" SSCP with arm numbness and nausea and vomiting. He came to the ED secondary to persistent nausea and vomiting. EKG with NSST changes, Troponin negative x 3. Myoview 05/23/15 low risk but abnormal. Telemetry shows NSR, SB.  Principal Problem:   Chest pain with moderate risk of acute coronary syndrome Active Problems:   Abnormal nuclear cardiac imaging test   Intractable nausea and vomiting   Bradycardia   Smoker   PLAN: Cath revealed normal coronaries, nausea better, OK to go home, we can f/u prn. DC statin and ASA.   Kerin Ransom PA-C 05/25/2015, 7:48 AM [phone]  Cardiology Attending  Patient seen and examined. Discussed findings of catheterization procedure. Agree with Mr. Tiajuana Amass note. [place] for discharge home. PPI prescribed.   Mikle Bosworth.D.

## 2015-05-25 NOTE — Discharge Instructions (Addendum)
Follow with Primary MD Tyler Stain, MD in 7 days   Get CBC, CMP, 2 view Chest X ray checked  by Primary MD next visit.    Activity: As tolerated with Full fall precautions use walker/cane & assistance as needed   Disposition Home     Diet: Heart Healthy    For Heart failure patients - Check your Weight same time everyday, if you gain over 2 pounds, or you develop in leg swelling, experience more shortness of breath or chest pain, call your Primary MD immediately. Follow Cardiac Low Salt Diet and 1.5 lit/day fluid restriction.   On your next visit with your primary care physician please Get Medicines reviewed and adjusted.   Please request your Prim.MD to go over all Hospital Tests and Procedure/Radiological results at the follow up, please get all Hospital records sent to your Prim MD by signing hospital release before you go home.   If you experience worsening of your admission symptoms, develop shortness of breath, life threatening emergency, suicidal or homicidal thoughts you must seek medical attention immediately by calling 911 or calling your MD immediately  if symptoms less severe.  You Must read complete instructions/literature along with all the possible adverse reactions/side effects for all the Medicines you take and that have been prescribed to you. Take any new Medicines after you have completely understood and accpet all the possible adverse reactions/side effects.   Do not drive, operating heavy machinery, perform activities at heights, swimming or participation in water activities or provide baby sitting services if your were admitted for syncope or siezures until you have seen by Primary MD or a Neurologist and advised to do so again.  Do not drive when taking Pain medications.    Do not take more than prescribed Pain, Sleep and Anxiety Medications  Special Instructions: If you have smoked or chewed Tobacco  in the last 2 yrs please stop smoking, stop any regular  Alcohol  and or any Recreational drug use.  Wear Seat belts while driving.   Please note  You were cared for by a hospitalist during your hospital stay. If you have any questions about your discharge medications or the care you received while you were in the hospital after you are discharged, you can call the unit and asked to speak with the hospitalist on call if the hospitalist that took care of you is not available. Once you are discharged, your primary care physician will handle any further medical issues. Please note that NO REFILLS for any discharge medications will be authorized once you are discharged, as it is imperative that you return to your primary care physician (or establish a relationship with a primary care physician if you do not have one) for your aftercare needs so that they can reassess your need for medications and monitor your lab values.        Radial Site Care Refer to this sheet in the next few weeks. These instructions provide you with information on caring for yourself after your procedure. Your caregiver may also give you more specific instructions. Your treatment has been planned according to current medical practices, but problems sometimes occur. Call your caregiver if you have any problems or questions after your procedure. HOME CARE INSTRUCTIONS  You may shower the day after the procedure.Remove the bandage (dressing) and gently wash the site with plain soap and water.Gently pat the site dry.  Do not apply powder or lotion to the site.  Do not submerge the affected  site in water for 3 to 5 days.  Inspect the site at least twice daily.  Do not flex or bend the affected arm for 24 hours.  No lifting over 5 pounds (2.3 kg) for 5 days after your procedure.  Do not drive home if you are discharged the same day of the procedure. Have someone else drive you.  You may drive 24 hours after the procedure unless otherwise instructed by your caregiver.  Do  not operate machinery or power tools for 24 hours.  A responsible adult should be with you for the first 24 hours after you arrive home. What to expect:  Any bruising will usually fade within 1 to 2 weeks.  Blood that collects in the tissue (hematoma) may be painful to the touch. It should usually decrease in size and tenderness within 1 to 2 weeks. SEEK IMMEDIATE MEDICAL CARE IF:  You have unusual pain at the radial site.  You have redness, warmth, swelling, or pain at the radial site.  You have drainage (other than a small amount of blood on the dressing).  You have chills.  You have a fever or persistent symptoms for more than 72 hours.  You have a fever and your symptoms suddenly get worse.  Your arm becomes pale, cool, tingly, or numb.  You have heavy bleeding from the site. Hold pressure on the site. Document Released: 12/05/2010 Document Revised: 01/25/2012 Document Reviewed: 12/05/2010 [facility] Patient Information 2015 [place], [place]. This information is not intended to replace advice given to you by your health care provider. Make sure you discuss any questions you have with your health care provider.

## 2015-05-26 MED ORDER — PANTOPRAZOLE SODIUM 40 MG PO TBEC: 40.0000 mg | DELAYED_RELEASE_TABLET | Freq: Every day | ORAL | Status: DC

## 2015-05-26 MED ORDER — PROMETHAZINE HCL 25 MG RE SUPP: 25.0000 mg | Freq: Four times a day (QID) | RECTAL | Status: DC | PRN

## 2015-05-26 MED ORDER — EMETROL 1.87-1.87-21.5 PO SOLN: 15.0000 mL | ORAL | Status: DC | PRN

## 2015-05-27 ENCOUNTER — Encounter (HOSPITAL_COMMUNITY): Payer: Self-pay | Admitting: Cardiovascular Disease

## 2015-05-28 ENCOUNTER — Encounter (HOSPITAL_COMMUNITY): Payer: Self-pay | Admitting: Emergency Medicine

## 2015-05-28 ENCOUNTER — Emergency Department (HOSPITAL_COMMUNITY): Payer: Self-pay

## 2015-05-28 ENCOUNTER — Other Ambulatory Visit: Payer: Self-pay

## 2015-05-28 ENCOUNTER — Emergency Department (HOSPITAL_COMMUNITY)
Admission: EM | Admit: 2015-05-28 | Discharge: 2015-05-28 | Disposition: A | Discharge status: Home / Self Care | Payer: Self-pay | Attending: Emergency Medicine | Admitting: Emergency Medicine

## 2015-05-28 DIAGNOSIS — R1013 Epigastric pain: Secondary | ICD-10-CM | POA: Insufficient documentation

## 2015-05-28 DIAGNOSIS — R251 Tremor, unspecified: Secondary | ICD-10-CM | POA: Insufficient documentation

## 2015-05-28 DIAGNOSIS — Z79899 Other long term (current) drug therapy: Secondary | ICD-10-CM | POA: Insufficient documentation

## 2015-05-28 DIAGNOSIS — Z9889 Other specified postprocedural states: Secondary | ICD-10-CM | POA: Insufficient documentation

## 2015-05-28 DIAGNOSIS — F1290 Cannabis use, unspecified, uncomplicated: Secondary | ICD-10-CM | POA: Insufficient documentation

## 2015-05-28 DIAGNOSIS — F1310 Sedative, hypnotic or anxiolytic abuse, uncomplicated: Secondary | ICD-10-CM | POA: Insufficient documentation

## 2015-05-28 DIAGNOSIS — R630 Anorexia: Secondary | ICD-10-CM | POA: Insufficient documentation

## 2015-05-28 DIAGNOSIS — R112 Nausea with vomiting, unspecified: Secondary | ICD-10-CM | POA: Insufficient documentation

## 2015-05-28 DIAGNOSIS — Z720 Tobacco use: Secondary | ICD-10-CM | POA: Insufficient documentation

## 2015-05-28 LAB — URINALYSIS, ROUTINE W REFLEX MICROSCOPIC
Bilirubin Urine: NEGATIVE
GLUCOSE, UA: NEGATIVE mg/dL
Ketones, ur: NEGATIVE mg/dL
LEUKOCYTES UA: NEGATIVE
Nitrite: NEGATIVE
Protein, ur: NEGATIVE mg/dL
Specific Gravity, Urine: 1.022 (ref 1.005–1.030)
Urobilinogen, UA: 0.2 mg/dL (ref 0.0–1.0)
pH: 6 (ref 5.0–8.0)

## 2015-05-28 LAB — CBC
HCT: 47.1 % (ref 39.0–52.0)
HEMOGLOBIN: 16.6 g/dL (ref 13.0–17.0)
MCH: 32 pg (ref 26.0–34.0)
MCHC: 35.2 g/dL (ref 30.0–36.0)
MCV: 90.9 fL (ref 78.0–100.0)
Platelets: 274 10*3/uL (ref 150–400)
RBC: 5.18 MIL/uL (ref 4.22–5.81)
RDW: 13.4 % (ref 11.5–15.5)
WBC: 11.2 10*3/uL — ABNORMAL HIGH (ref 4.0–10.5)

## 2015-05-28 LAB — COMPREHENSIVE METABOLIC PANEL
ALT: 14 U/L — ABNORMAL LOW (ref 17–63)
AST: 19 U/L (ref 15–41)
Albumin: 4.4 g/dL (ref 3.5–5.0)
Alkaline Phosphatase: 78 U/L (ref 38–126)
Anion gap: 12 (ref 5–15)
BUN: 17 mg/dL (ref 6–20)
CO2: 23 mmol/L (ref 22–32)
Calcium: 10.1 mg/dL (ref 8.9–10.3)
Chloride: 98 mmol/L — ABNORMAL LOW (ref 101–111)
Creatinine, Ser: 1.17 mg/dL (ref 0.61–1.24)
GLUCOSE: 123 mg/dL — AB (ref 65–99)
POTASSIUM: 3.8 mmol/L (ref 3.5–5.1)
SODIUM: 133 mmol/L — AB (ref 135–145)
Total Bilirubin: 1 mg/dL (ref 0.3–1.2)
Total Protein: 7.3 g/dL (ref 6.5–8.1)

## 2015-05-28 LAB — URINE MICROSCOPIC-ADD ON

## 2015-05-28 LAB — RAPID URINE DRUG SCREEN, HOSP PERFORMED
Amphetamines: NOT DETECTED
Barbiturates: NOT DETECTED
Benzodiazepines: POSITIVE — AB
COCAINE: NOT DETECTED
OPIATES: NOT DETECTED
TETRAHYDROCANNABINOL: POSITIVE — AB

## 2015-05-28 LAB — LIPASE, BLOOD: Lipase: 20 U/L — ABNORMAL LOW (ref 22–51)

## 2015-05-28 MED ORDER — OMEPRAZOLE 20 MG PO CPDR: 20.0000 mg | DELAYED_RELEASE_CAPSULE | Freq: Every day | ORAL | Status: DC

## 2015-05-28 MED ORDER — SODIUM CHLORIDE 0.9 % IV SOLN
1000.0000 mL | INTRAVENOUS | Status: DC
  Administered 2015-05-28: 1000 mL via INTRAVENOUS

## 2015-05-28 MED ORDER — SODIUM CHLORIDE 0.9 % IV SOLN
1000.0000 mL | Freq: Once | INTRAVENOUS | Status: AC
  Administered 2015-05-28: 1000 mL via INTRAVENOUS

## 2015-05-28 MED ORDER — ONDANSETRON 4 MG PO TBDP: ORAL_TABLET | ORAL | Status: DC

## 2015-05-28 MED ORDER — ZOLPIDEM TARTRATE 5 MG PO TABS: 5.0000 mg | ORAL_TABLET | Freq: Every evening | ORAL | Status: DC | PRN

## 2015-05-28 MED ORDER — PANTOPRAZOLE SODIUM 40 MG IV SOLR
40.0000 mg | Freq: Once | INTRAVENOUS | Status: AC
  Administered 2015-05-28: 40 mg via INTRAVENOUS
  Filled 2015-05-28: qty 40

## 2015-05-28 MED ORDER — ONDANSETRON HCL 4 MG/2ML IJ SOLN
4.0000 mg | Freq: Once | INTRAMUSCULAR | Status: AC | PRN
  Administered 2015-05-28: 4 mg via INTRAVENOUS
  Filled 2015-05-28: qty 2

## 2015-05-28 NOTE — ED Notes (Signed)
Patient transported to X-ray 

## 2015-05-28 NOTE — Discharge Instructions (Signed)
Abdominal Pain Many things can cause abdominal pain. Usually, abdominal pain is not caused by a disease and will improve without treatment. It can often be observed and treated at home. Your health care provider will do a physical exam and possibly order blood tests and X-rays to help determine the seriousness of your pain. However, in many cases, more time must pass before a clear cause of the pain can be found. Before that point, your health care provider may not know if you need more testing or further treatment. HOME CARE INSTRUCTIONS  Monitor your abdominal pain for any changes. The following actions may help to alleviate any discomfort you are experiencing:  Only take over-the-counter or prescription medicines as directed by your health care provider.  Do not take laxatives unless directed to do so by your health care provider.  Try a clear liquid diet (broth, tea, or water) as directed by your health care provider. Slowly move to a bland diet as tolerated. SEEK MEDICAL CARE IF:  You have unexplained abdominal pain.  You have abdominal pain associated with nausea or diarrhea.  You have pain when you urinate or have a bowel movement.  You experience abdominal pain that wakes you in the night.  You have abdominal pain that is worsened or improved by eating food.  You have abdominal pain that is worsened with eating fatty foods.  You have a fever. SEEK IMMEDIATE MEDICAL CARE IF:   Your pain does not go away within 2 hours.  You keep throwing up (vomiting).  Your pain is felt only in portions of the abdomen, such as the right side or the left lower portion of the abdomen.  You pass bloody or black tarry stools. MAKE SURE YOU:  Understand these instructions.   Will watch your condition.   Will get help right away if you are not doing well or get worse.  Document Released: 08/12/2005 Document Revised: 11/07/2013 Document Reviewed: 07/12/2013 [facility] Patient Information  2015 [place], [place]. This information is not intended to replace advice given to you by your health care provider. Make sure you discuss any questions you have with your health care provider. POSSIBLE Gastritis, Adult OR ULCER Gastritis is soreness and swelling (inflammation) of the lining of the stomach. Gastritis can develop as a sudden onset (acute) or long-term (chronic) condition. If gastritis is not treated, it can lead to stomach bleeding and ulcers. CAUSES  Gastritis occurs when the stomach lining is weak or damaged. Digestive juices from the stomach then inflame the weakened stomach lining. The stomach lining may be weak or damaged due to viral or bacterial infections. One common bacterial infection is the Helicobacter pylori infection. Gastritis can also result from excessive alcohol consumption, taking certain medicines, or having too much acid in the stomach.  SYMPTOMS  In some cases, there are no symptoms. When symptoms are present, they may include:  Pain or a burning sensation in the upper abdomen.  Nausea.  Vomiting.  An uncomfortable feeling of fullness after eating. DIAGNOSIS  Your caregiver may suspect you have gastritis based on your symptoms and a physical exam. To determine the cause of your gastritis, your caregiver may perform the following:  Blood or stool tests to check for the H pylori bacterium.  Gastroscopy. A thin, flexible tube (endoscope) is passed down the esophagus and into the stomach. The endoscope has a light and camera on the end. Your caregiver uses the endoscope to view the inside of the stomach.  Taking a tissue  sample (biopsy) from the stomach to examine under a microscope. TREATMENT  Depending on the cause of your gastritis, medicines may be prescribed. If you have a bacterial infection, such as an H pylori infection, antibiotics may be given. If your gastritis is caused by too much acid in the stomach, H2 blockers or antacids may be given. Your  caregiver may recommend that you stop taking aspirin, ibuprofen, or other nonsteroidal anti-inflammatory drugs (NSAIDs). HOME CARE INSTRUCTIONS  Only take over-the-counter or prescription medicines as directed by your caregiver.  If you were given antibiotic medicines, take them as directed. Finish them even if you start to feel better.  Drink enough fluids to keep your urine clear or pale yellow.  Avoid foods and drinks that make your symptoms worse, such as:  Caffeine or alcoholic drinks.  Chocolate.  Peppermint or mint flavorings.  Garlic and onions.  Spicy foods.  Citrus fruits, such as oranges, lemons, or limes.  Tomato-based foods such as sauce, chili, salsa, and pizza.  Fried and fatty foods.  Eat small, frequent meals instead of large meals. SEEK IMMEDIATE MEDICAL CARE IF:   You have black or dark red stools.  You vomit blood or material that looks like coffee grounds.  You are unable to keep fluids down.  Your abdominal pain gets worse.  You have a fever.  You do not feel better after 1 week.  You have any other questions or concerns. MAKE SURE YOU:  Understand these instructions.  Will watch your condition.  Will get help right away if you are not doing well or get worse. Document Released: 10/27/2001 Document Revised: 05/03/2012 Document Reviewed: 12/16/2011 [facility] Patient Information 2015 [place], [place]. This information is not intended to replace advice given to you by your health care provider. Make sure you discuss any questions you have with your health care provider.  Emergency Department Resource Guide 1) Find a Doctor and Pay Out of Pocket Although you won't have to find out who is covered by your insurance plan, it is a good idea to ask around and get recommendations. You will then need to call the office and see if the doctor you have chosen will accept you as a new patient and what types of options they offer for patients who are  self-pay. Some doctors offer discounts or will set up payment plans for their patients who do not have insurance, but you will need to ask so you aren't surprised when you get to your appointment.  2) Contact Your Local Health Department Not all health departments have doctors that can see patients for sick visits, but many do, so it is worth a call to see if yours does. If you don't know where your local health department is, you can check in your phone book. The CDC also has a tool to help you locate your state's health department, and many state websites also have listings of all of their local health departments.  3) Find a [facility] If your illness is not likely to be very severe or complicated, you may want to try a walk in clinic. These are popping up all over the country in pharmacies, drugstores, and shopping centers. They're usually staffed by nurse practitioners or physician assistants that have been trained to treat common illnesses and complaints. They're usually fairly quick and inexpensive. However, if you have serious medical issues or chronic medical problems, these are probably not your best option.  No Primary Care Doctor: - Call Health Connect at  [phone] - they can help you locate a primary care doctor that  accepts your insurance, provides certain services, etc. - Physician Referral Service- [phone]  Chronic Pain Problems: Organization         Address  Phone   Notes  [facility]  [phone] Patients need to be referred by their primary care doctor.   Medication Assistance: Organization         Address  Phone   Notes  [facility] Medication [facility] [place], [place] [address] [phone] --Must be a resident of [facility] -- Must have NO insurance coverage whatsoever (no Medicaid/ Medicare, etc.) -- The pt. MUST have a primary care doctor that directs their care regularly and follows them in  the community   MedAssist  [phone]   [employer]  [phone]    Agencies that provide inexpensive medical care: Organization         Address  Phone   Notes  [place]  [phone]   [facility]    [phone]   [facility] [place], [place] [address] [phone]   [place] [address], [place] [phone]   Planned Parenthood    [phone]   [facility]    [phone]   [place] and [address], [place] Phone:  [phone], Fax:  [phone] Hours of Operation:  9 am - 6 pm, M-F.  Also accepts Medicaid/Medicare and self-pay.  [facility] for [place], [address], [place] Phone: [phone], Fax: [phone]. Hours of Operation:  8:30 am - 5:30 pm, M-F.  Also accepts Medicaid and self-pay.  [facility] [place] [address] [place] Phone: [phone]   [place], [place], [place] [phone], Ext. [phone] Mondays & Thursdays: 7-9 AM.  First 15 patients are seen on a first come, first serve basis.    [place] Providers:  Organization         Address  Phone   Notes  [facility] [address], [place] [phone] Also accepts self-pay patients.  [facility] [address], [place]  [phone]   [place], [address], [place] [phone]   [facility] [address], [place] [phone]   Lucianne Lei [address], [place]   [phone] Only accepts [place] Access [place] patients after they have their name applied to their card.   Self-Pay (no insurance) in [facility]:  Organization         Address  Phone   Notes  Sickle Cell Patients,  [facility] Internal Medicine [place] [phone]   [facility] [place] [phone]   [facility] [place]  [place], [place], [place] [phone]   [facility]/Dr. Osei-Bonsu  [address], [place] or [address], [place] [phone] Phone number for both [place] and [place] locations is the same.  [facility]gent [facility] and [facility] [address], [place] [phone]   [facility] [place]  or [address] [phone]   [facility] [phone], phone; [phone], fax Sees patients 1st and 3rd Saturday of every month.  Must not qualify for public or private insurance (i.e. Medicaid, Medicare, [place] Health Choice, Veterans' Benefits)  Household income should be no more than 200% of the poverty level The clinic cannot treat you if you are pregnant or think you are pregnant  Sexually transmitted diseases are not treated at the clinic.    Dental Care: Organization         Address  Phone  Notes  [facility] Department of [facility] [place] [phone] Accepts children up to age 65 who are enrolled in [place] or [place]; pregnant women with a Medicaid card; and children who have applied for Medicaid or [place] Health Choice, but were declined, whose parents can pay a reduced fee at time of service.  [facility] Department of [facility]  [address], [place] [phone] Accepts children up to age 84 who are enrolled in [place] or [place]; pregnant women with a Medicaid card; and children who have applied for Medicaid or [place] Health Choice, but were declined, whose parents can pay a reduced fee at time of service.  [facility] Adult Dental Access PROGRAM   [place] [phone] Patients are seen by appointment only. Walk-ins are not accepted. [place] will see patients 35 years of age and older. Monday - Tuesday (8am-5pm) Most Wednesdays (8:30-5pm) $30 per visit, cash only  [facility] Adult Dental Access PROGRAM  [address], [facility] [phone] Patients are seen by appointment only. Walk-ins are not accepted. [place] will see patients 43 years of age and older. One Wednesday Evening (Monthly: Volunteer Based).  $30 per visit, cash only  [place]  [phone] for adults; Children under age 32, call Graduate Pediatric Dentistry at [phone]. Children aged 26-14, please call [phone] to request a pediatric application.  Dental services are provided in all areas of dental care including fillings, crowns and bridges, complete and partial dentures, implants, gum treatment, root canals, and extractions. Preventive care is also provided. Treatment is provided to both adults and children. Patients are selected via a lottery and there is often a waiting list.   [facility] [address] [place]  [phone] [email or web address]   [facility] [address] [place], [place] [phone], Ext. [phone] Second and Fourth Thursday of each month, opens at 6:30 AM; Clinic ends at 9 AM.  Patients are seen on a first-come first-served basis, and a limited number are seen during each clinic.   [facility]  [address] Hillard Danker [place], [place] [phone]   Eligibility Requirements You must have lived in [place], [place], or [place] counties for at least the last three months.   You cannot be eligible for state or federal sponsored [employer], including [employer], [place], or [employer].   You generally cannot be eligible for healthcare insurance through your employer.    How to apply: Eligibility screenings are  held every Tuesday and Wednesday afternoon from 1:00 pm until 4:00 pm. You do not need an appointment for the interview!  [facility] [address] [place], [place]   [place]  [place] Department  [place]  [phone]    Behavioral Health Resources in the Community: Intensive Outpatient Programs Organization         Address  Phone  Notes  [place]. [address] [place], [place] [phone]   [facility] Outpatient [address] [place], [place]   ADS: Alcohol & Drug Svcs [address] [place], [place]   [place] [address],  [place], [place] or [phone]   Substance Abuse Resources Organization         Address  Phone  Notes  Alcohol and Drug Services  [phone]   [place]  [phone]   The [place]   Chinita Pester  [phone]   Residential & Outpatient Substance Abuse Program  [phone]   Psychological Services Organization         Address  Phone  Notes  [facility] [place]  [place]  [phone]   [place] [address] [place] or [phone]    Mobile Crisis Teams Organization         Address  Phone  Notes  [facility]atives, Mobile Crisis Care Unit  [phone]   Assertive Psychotherapeutic Services  [address] [place], [place]   [place] [address] [place] [phone]    Self-Help/Support Groups Organization         Address  Phone             Notes  [place]. of [place] - variety of support groups  [place] Call for more information  Narcotics Anonymous (NA), [facility] [address], [employer] [place]  2 meetings at this location     Special educational needs teacher         Address  Phone  Notes  [facility]idential [facility] [place],    [place]  [phone]   [facility]  [address] [place] 6[address], [place], [place]   [place], [place] [phone] Admissions: 8am-3pm M-F  Incentives Substance [place] [address]    [place], [place] [phone]   [facility] [address] [place], [place], [place]   [facility] [address],  [place], [place]   [facility] Programs - Intensive Outpatient [address] Kristeen [address], [place], [place]   [facility] ([place].,  [place], [place] [phone] or [phone]   [facility]idential Treatment Services (RTS) [address], [place], [place] Accepts Medicaid  [facility] [place] [address],  [employer] [place] [phone] Substance Abuse/Addiction Treatment   [facility] Organization         Address  Phone  Notes  [facility]  [phone]   Domenic Schwab, PhD [address] [place], [place]   [phone] or [phone]   [place], [place] [phone]   [place] [address], [place], [place] [phone] Insurance/Medicaid/sponsorship through [employer] and Families [address], [place] 076  [place], [place] [phone] [place], [place] [phone]    Dr. Adele Schilder  [phone]   [facility] Dept. 1) [address], [place] 2) [place] 3)  [place] [address], [place] [phone]  [phone]   [place] [phone] or [phone] (After  Hours)

## 2015-05-28 NOTE — ED Notes (Signed)
Pt. Was admitted to [facility] for dehydrated , and was transferred to [facility] for a cardiac cath, it was clean.  Im back cause Im still vomiting and with stomach pain

## 2015-05-28 NOTE — ED Notes (Signed)
Pt returned from xray

## 2015-05-28 NOTE — ED Provider Notes (Signed)
CSN: [id number]     Arrival date & time 05/28/15  1017 History   First MD Initiated Contact with Patient 05/28/15 1039     Chief Complaint  Patient presents with  . Emesis  . Nausea     (Consider location/radiation/quality/duration/timing/severity/associated sxs/prior Treatment) HPI The patient reports he's had epigastric pain ever since his initial evaluation in the hospital. He also reports he frequently gets vomiting episodes. He reports he had extensive workup for cardiac problems none of which yielded a diagnosis. He reports he felt a little better when he left the hospital but since and now has had loss of appetite and vomiting with any oral intake. There has not been any fever. No diarrhea. Pain is generally epigastric and aching in quality. Patient temporarily improves getting into a warm bath. History reviewed. No pertinent past medical history. Past Surgical History  Procedure Laterality Date  . Cardiac catheterization N/A 05/24/2015    Procedure: Left Heart Cath and Coronary Angiography;  Surgeon: Sherren Mocha, MD;  Location: [facility];  Service: Cardiovascular;  Laterality: N/A;   Family History  Problem Relation Age of Onset  . Diabetes type II Mother   . Heart failure Mother   . Hyperlipidemia Father   . CAD Other    History  Substance Use Topics  . Smoking status: Current Every Day Smoker  . Smokeless tobacco: Not on file  . Alcohol Use: No    Review of Systems 10 Systems reviewed and are negative for acute change except as noted in the HPI.    Allergies  Review of patient's allergies indicates no known allergies.  Home Medications   Prior to Admission medications   Medication Sig Start Date End Date Taking? Authorizing Provider  ibuprofen (ADVIL,MOTRIN) 200 MG tablet Take 400 mg by mouth every 6 (six) hours as needed for moderate pain.   Yes Historical Provider, MD  pantoprazole (PROTONIX) 40 MG tablet Take 1 tablet (40 mg total) by mouth daily  at 12 noon. Can't substitute to any covered brand 05/26/15  Yes Annita Brod, MD  promethazine (PHENERGAN) 25 MG suppository Place 1 suppository (25 mg total) rectally every 6 (six) hours as needed for nausea or vomiting. 05/26/15  Yes Annita Brod, MD  anti-nausea (EMETROL) solution Take 15 mLs by mouth every 15 (fifteen) minutes as needed for nausea or vomiting. Patient not taking: Reported on 05/28/2015 05/26/15   Annita Brod, MD  omeprazole (PRILOSEC) 20 MG capsule Take 1 capsule (20 mg total) by mouth daily. 05/28/15   Charlesetta Shanks, MD  ondansetron (ZOFRAN ODT) 4 MG disintegrating tablet 4mg  ODT q4 hours prn nausea/vomit 05/28/15   Charlesetta Shanks, MD  zolpidem (AMBIEN) 5 MG tablet Take 1 tablet (5 mg total) by mouth at bedtime as needed for sleep. 05/28/15   Charlesetta Shanks, MD   BP 128/63 mmHg  Pulse 52  Temp(Src) 98 F (36.7 C) (Oral)  Resp 20  Ht 5\' 8"  (1.727 m)  Wt 170 lb (77.111 kg)  BMI 25.85 kg/m2  SpO2 99% Physical Exam  Constitutional: He is oriented to person, place, and time. He appears well-developed and well-nourished.  Patient sometimes is shaking but is nontoxic in appearance. He is not diaphoretic and he has no respiratory distress. With distraction and evaluation, the symptoms abide temporarily.  HENT:  Head: Normocephalic and atraumatic.  Nose: Nose normal.  Mouth/Throat: Oropharynx is clear and moist.  Eyes: EOM are normal. Pupils are equal, round, and reactive to light.  Neck: Neck supple.  Cardiovascular: Normal rate, regular rhythm, normal heart sounds and intact distal pulses.   Pulmonary/Chest: Effort normal and breath sounds normal.  Abdominal: Soft. Bowel sounds are normal. He exhibits no distension. There is tenderness.  A she endorses epigastric tenderness to palpation is no guarding or rebound.  Musculoskeletal: Normal range of motion. He exhibits no edema.  There are multiple bruises from presumably prior IV sites on the patient's forearms  and antecubital fossa. The patient's feet and legs are in good condition without edema or cellulitis.  Neurological: He is alert and oriented to person, place, and time. He has normal strength. No cranial nerve deficit. He exhibits normal muscle tone. Coordination normal. GCS eye subscore is 4. GCS verbal subscore is 5. GCS motor subscore is 6.  Skin: Skin is warm, dry and intact. No rash noted.  Psychiatric: He has a normal mood and affect.    ED Course  Procedures (including critical care time) Labs Review Labs Reviewed  LIPASE, BLOOD - Abnormal; Notable for the following:    Lipase 20 (*)    All other components within normal limits  COMPREHENSIVE METABOLIC PANEL - Abnormal; Notable for the following:    Sodium 133 (*)    Chloride 98 (*)    Glucose, Bld 123 (*)    ALT 14 (*)    All other components within normal limits  CBC - Abnormal; Notable for the following:    WBC 11.2 (*)    All other components within normal limits  URINALYSIS, ROUTINE W REFLEX MICROSCOPIC (NOT AT [facility]) - Abnormal; Notable for the following:    Hgb urine dipstick SMALL (*)    All other components within normal limits  URINE RAPID DRUG SCREEN, HOSP PERFORMED - Abnormal; Notable for the following:    Benzodiazepines POSITIVE (*)    Tetrahydrocannabinol POSITIVE (*)    All other components within normal limits  URINE MICROSCOPIC-ADD ON - Abnormal; Notable for the following:    Bacteria, UA FEW (*)    Casts HYALINE CASTS (*)    All other components within normal limits    Imaging Review Dg Abd Acute W/chest  05/28/2015   CLINICAL DATA:  Chest pain, dehydration, vomiting  EXAM: DG ABDOMEN ACUTE W/ 1V CHEST  COMPARISON:  05/23/2015, 05/22/2015  FINDINGS: There is no evidence of dilated bowel loops or free intraperitoneal air. No radiopaque calculi or other significant radiographic abnormality is seen. Heart size and mediastinal contours are within normal limits. Both lungs are clear.  IMPRESSION: Negative  abdominal radiographs.  No acute cardiopulmonary disease.   Electronically Signed   By: Jerilynn Mages.  Shick M.D.   On: 05/28/2015 13:38     EKG Interpretation None      MDM   Final diagnoses:  Epigastric pain  Non-intractable vomiting with nausea, vomiting of unspecified type  Marijuana use, continuous   At this time there is no distinct etiology for the patient's symptoms. He does have mild epigastric test palpation but no surgical findings. He had a CT abdomen at his last admission with no intra-abdominal findings. Plain film x-rays today do not show any obstruction. Vital signs and lab work do not indicate significant dehydration. I did advise the patient with his marijuana use there was a potential for hyperemesis cannabinoids syndrome. Patient did not feel that this was likely is he's been smoking marijuana since he was approximately 44 years old. He reports that he feels improved after he smokes this. I did however consultation on nature  of the syndrome and advised him to consider discontinuing to assess if improvement occurs. The patient be started on Prilosec daily and Zofran for nausea as needed. He is advised these both family physician in gastric neurology follow-up.    Charlesetta Shanks, MD 05/28/15 1650

## 2015-05-30 ENCOUNTER — Ambulatory Visit: Payer: Self-pay | Admitting: Family Medicine

## 2016-09-13 IMAGING — CT CT ANGIO CHEST
2 of 7 series · 16 of 46 positions shown · IV contrast (OMNIPAQUE 300)
Comparison: CT of the abdomen and pelvis dated 05/09/2014

CLINICAL DATA: 43-year-old male with chest pain.

EXAM:
CT ANGIOGRAPHY CHEST, ABDOMEN AND PELVIS
TECHNIQUE: Multidetector CT imaging through the chest, abdomen and pelvis was
performed using the standard protocol during bolus administration of
intravenous contrast. Multiplanar reconstructed images and MIPs were
obtained and reviewed to evaluate the vascular anatomy.
CONTRAST:  100mL OMNIPAQUE IOHEXOL 350 MG/ML SOLN

[Series 5: arterial 3.0 b30f · axial · arterial · 0.74mm/px · z∈[+1012,+1558]mm · 13 of 202 slices shown]
[im 10/202  lung]
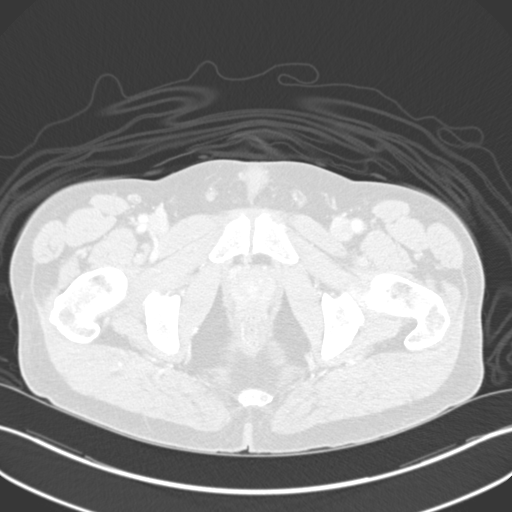
[im 29/202  soft-tissue]
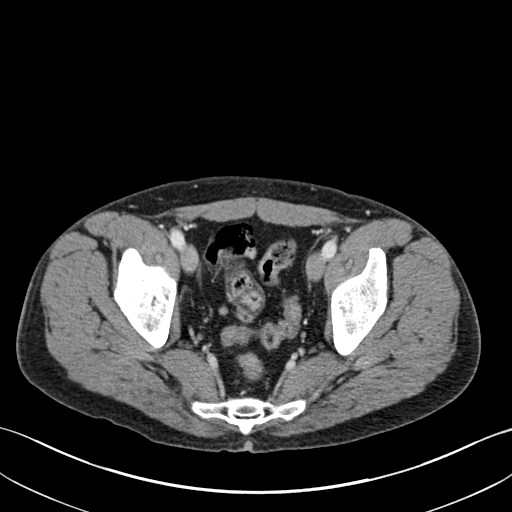
[im 39/202  lung]
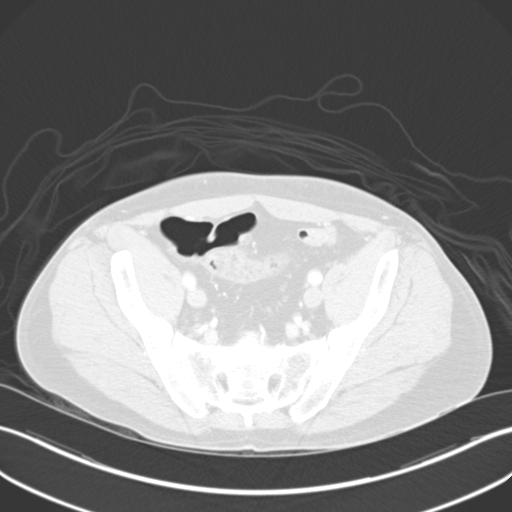
[im 58/202  soft-tissue]
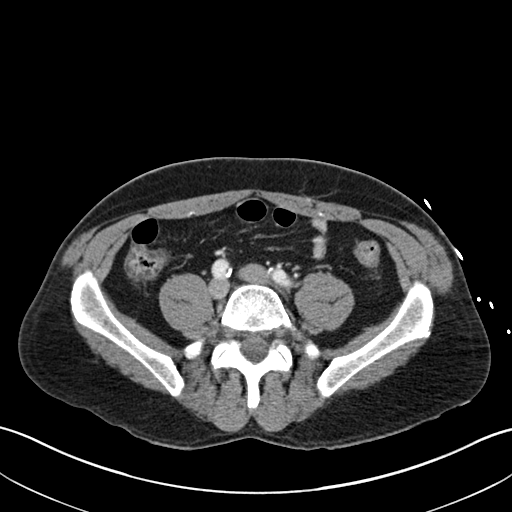
[im 68/202  lung]
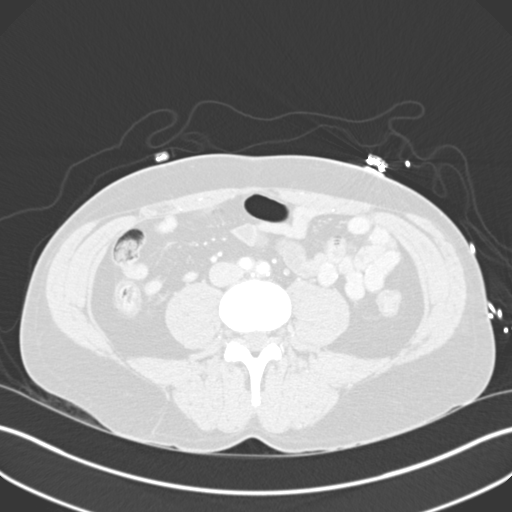
[im 87/202  soft-tissue]
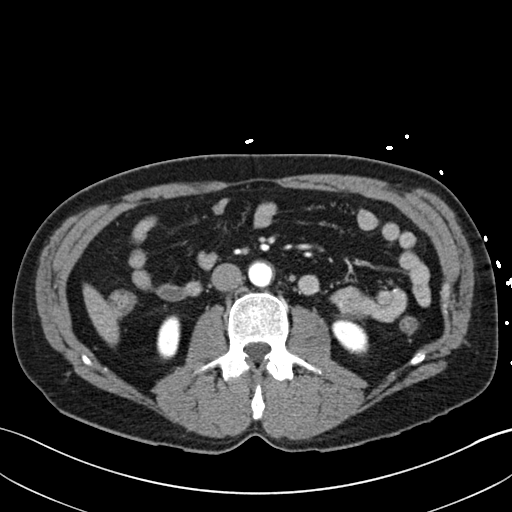
[im 106/202  lung]
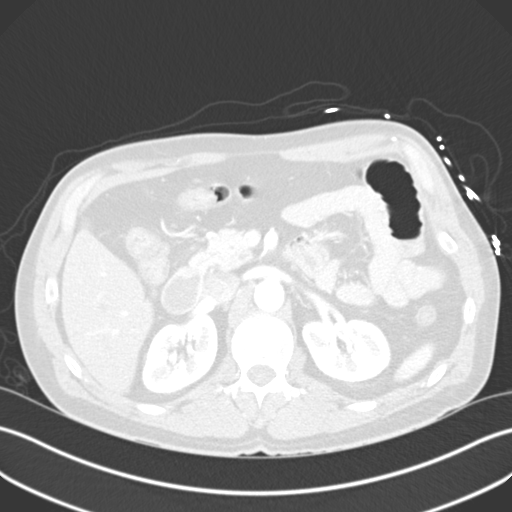
[im 115/202  soft-tissue]
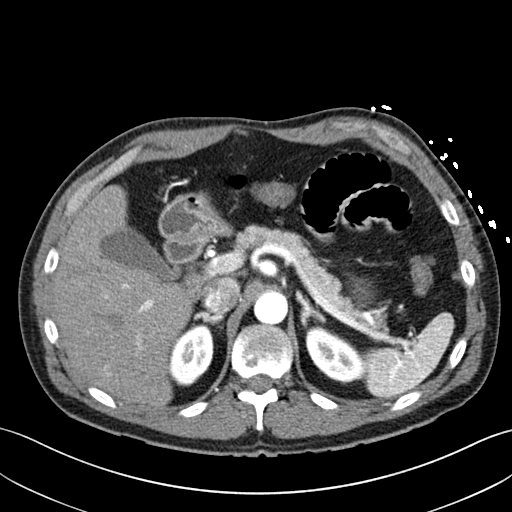
[im 135/202  lung]
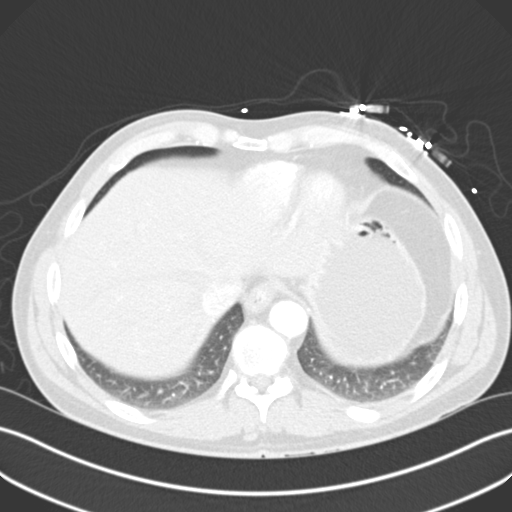
[im 144/202  soft-tissue]
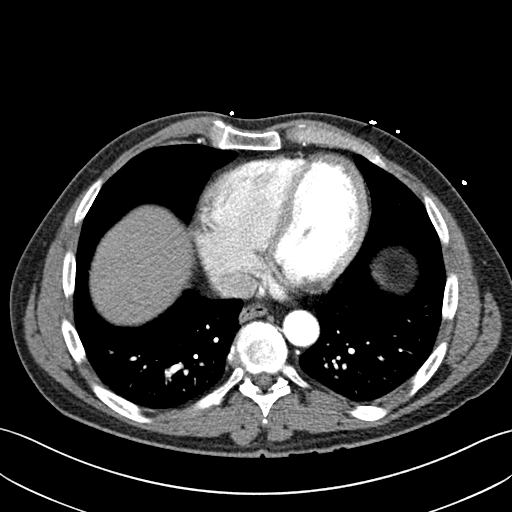
[im 163/202  lung]
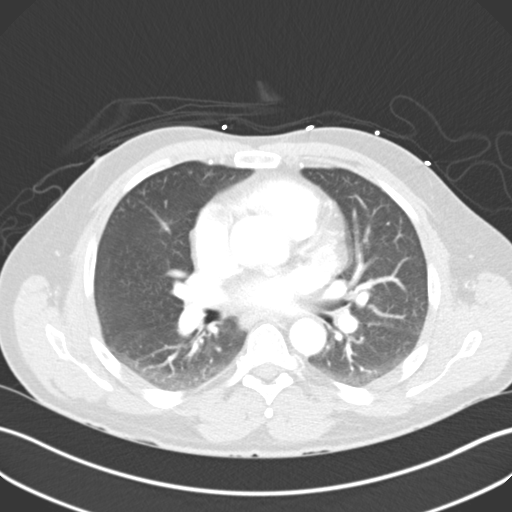
[im 173/202  soft-tissue]
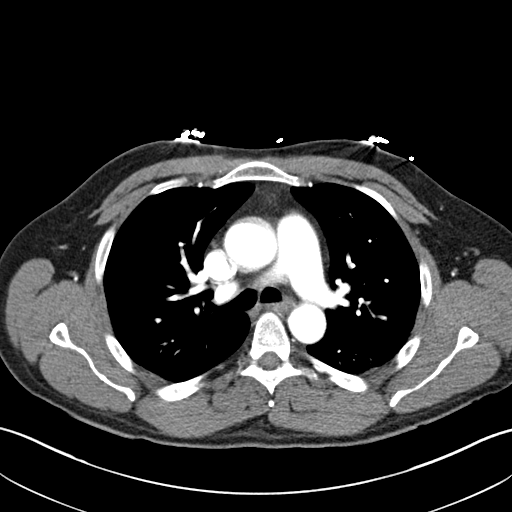
[im 192/202  lung]
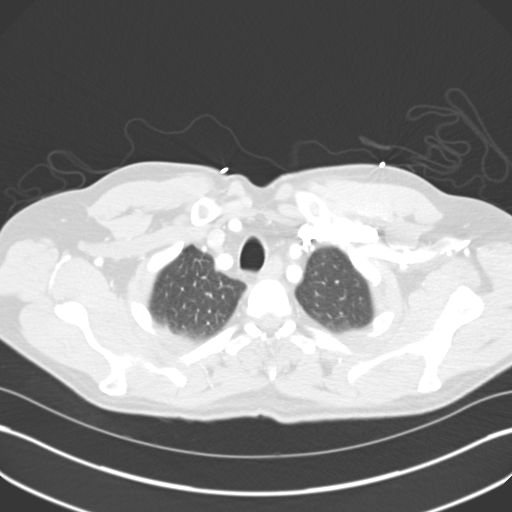

[Series 7: coronal mpr · coronal · 0.70mm/px · 3 of 126 slices shown]
[im 32/126  soft-tissue]
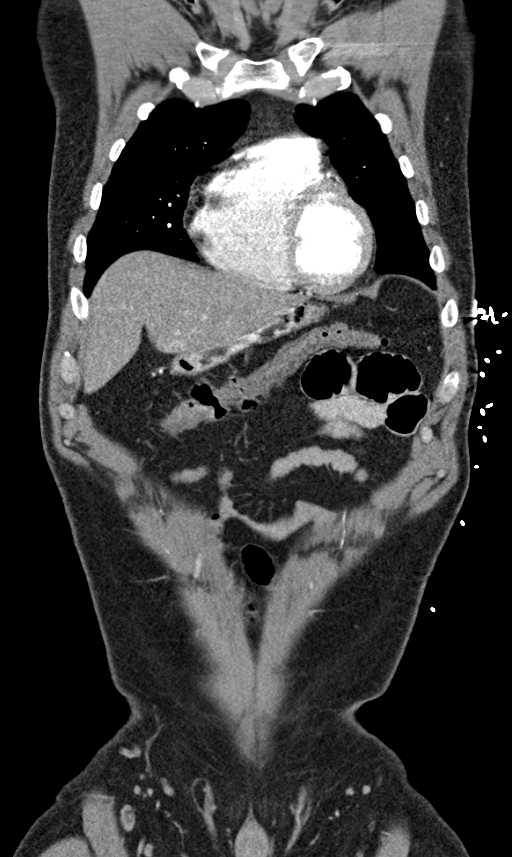
[im 63/126  soft-tissue]
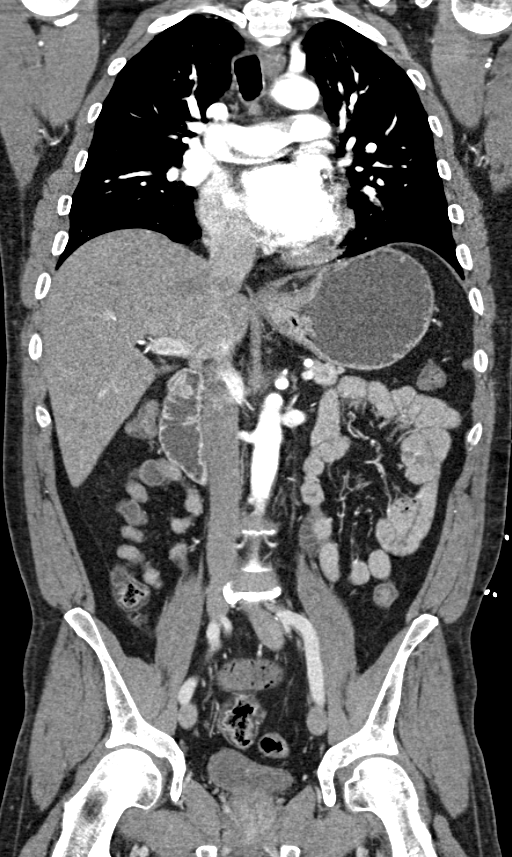
[im 94/126  soft-tissue]
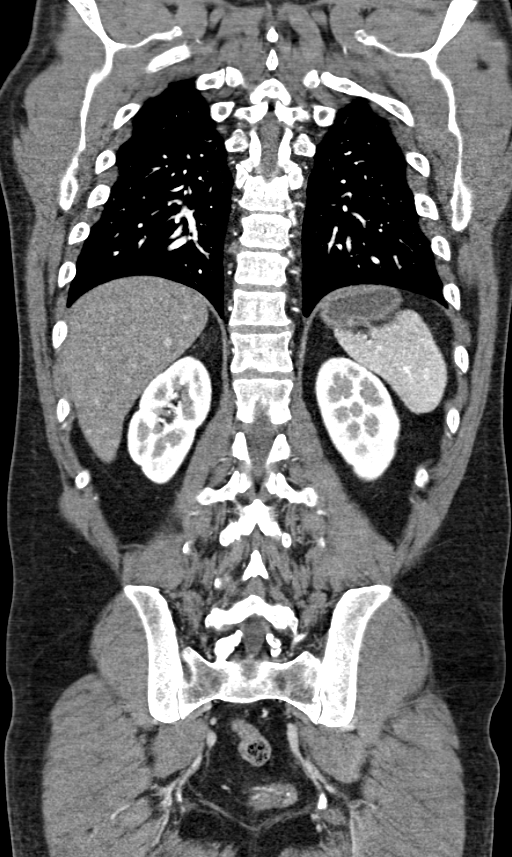

[16 of 46 positions shown; findings below may reference images not displayed]

FINDINGS: CTA CHEST FINDINGS

The lungs are clear. No pleural effusion. The central airways are
patent.

The thoracic aorta appears unremarkable. No CT evidence of pulmonary
embolism. No lymphadenopathy. No cardiomegaly or pericardial
effusion. The mediastinum is unremarkable. The osseous structures
appear unremarkable. No acute fracture.

Review of the MIP images confirms the above findings.

CTA ABDOMEN AND PELVIS FINDINGS

No intra-abdominal free air or free fluid.

Subcentimeter right hepatic subcapsular enhancing focus (series 5,
image [DATE] represent a small vessel or a flash filling hemangioma
or small portal venous shunting. The previously described hypodense
lesion in the posterior right lobe of the liver is faintly
visualized on the current study and not well characterized. This
lesion was previously characterized as hemangioma. A 1 cm enhancing
focus is noted in the left lobe of the liver inferiorly (series 5,
image 82) which is not well characterized but may represent flash
filling hemangioma or portal venous shunting. MRI may provide better
characterization if clinically indicated. The gallbladder, pancreas,
spleen, adrenal glands, kidneys, visualized ureters, and urinary
bladder anterior unremarkable. The prostate gland is unremarkable by
size criteria.

Top-normal loop of small bowel noted in the left upper abdomen.
There is no evidence of bowel obstruction or inflammation. The
appendix appears unremarkable.

Mild atherosclerotic calcification of the aorta. The origins of the
celiac axis, SMA, IMA as well as the origins of the renal arteries
are patent. No lymphadenopathy.

Small fat containing umbilical hernia. Mild degenerative changes of
the spine. No acute fracture.

Review of the MIP images confirms the above findings.
IMPRESSION: No CT evidence of pulmonary embolism or aortic dissection.

Multiple small hepatic enhancing foci, incompletely characterized,
likely flash filling hemangioma or portal venous shunting.

No evidence of bowel obstruction or inflammation.  Normal appendix.

## 2022-05-25 ENCOUNTER — Other Ambulatory Visit: Payer: Self-pay

## 2022-05-25 ENCOUNTER — Emergency Department: Payer: Commercial Managed Care - PPO

## 2022-05-25 ENCOUNTER — Emergency Department
Admission: EM | Admit: 2022-05-25 | Discharge: 2022-05-25 | Disposition: A | Discharge status: Home / Self Care | Payer: Commercial Managed Care - PPO | Attending: Emergency Medicine | Admitting: Emergency Medicine

## 2022-05-25 DIAGNOSIS — R112 Nausea with vomiting, unspecified: Secondary | ICD-10-CM | POA: Diagnosis present

## 2022-05-25 DIAGNOSIS — R197 Diarrhea, unspecified: Secondary | ICD-10-CM | POA: Insufficient documentation

## 2022-05-25 DIAGNOSIS — K209 Esophagitis, unspecified without bleeding: Secondary | ICD-10-CM

## 2022-05-25 DIAGNOSIS — R1013 Epigastric pain: Secondary | ICD-10-CM | POA: Diagnosis not present

## 2022-05-25 LAB — CBC
HCT: 49.5 % (ref 39.0–52.0)
Hemoglobin: 17.2 g/dL — ABNORMAL HIGH (ref 13.0–17.0)
MCH: 31.2 pg (ref 26.0–34.0)
MCHC: 34.7 g/dL (ref 30.0–36.0)
MCV: 89.7 fL (ref 80.0–100.0)
Platelets: 277 10*3/uL (ref 150–400)
RBC: 5.52 MIL/uL (ref 4.22–5.81)
RDW: 13.4 % (ref 11.5–15.5)
WBC: 22.5 10*3/uL — ABNORMAL HIGH (ref 4.0–10.5)
nRBC: 0 % (ref 0.0–0.2)

## 2022-05-25 LAB — URINALYSIS, MICROSCOPIC (REFLEX): WBC, UA: >50 WBC/hpf (ref 0–5)

## 2022-05-25 LAB — URINALYSIS, ROUTINE W REFLEX MICROSCOPIC
Glucose, UA: NEGATIVE mg/dL
Leukocytes,Ua: NEGATIVE
Nitrite: NEGATIVE
Protein, ur: >300 mg/dL — AB
Specific Gravity, Urine: >1.03 — ABNORMAL HIGH (ref 1.005–1.030)
pH: 5 (ref 5.0–8.0)

## 2022-05-25 LAB — COMPREHENSIVE METABOLIC PANEL
ALT: 18 U/L (ref 0–44)
AST: 40 U/L (ref 15–41)
Albumin: 5.2 g/dL — ABNORMAL HIGH (ref 3.5–5.0)
Alkaline Phosphatase: 88 U/L (ref 38–126)
Anion gap: 17 — ABNORMAL HIGH (ref 5–15)
BUN: 22 mg/dL — ABNORMAL HIGH (ref 6–20)
CO2: 18 mmol/L — ABNORMAL LOW (ref 22–32)
Calcium: 10.4 mg/dL — ABNORMAL HIGH (ref 8.9–10.3)
Chloride: 101 mmol/L (ref 98–111)
Creatinine, Ser: 1.6 mg/dL — ABNORMAL HIGH (ref 0.61–1.24)
GFR, Estimated: 52 mL/min — ABNORMAL LOW (ref >60)
Glucose, Bld: 178 mg/dL — ABNORMAL HIGH (ref 70–99)
Potassium: 3.7 mmol/L (ref 3.5–5.1)
Sodium: 136 mmol/L (ref 135–145)
Total Bilirubin: 1.2 mg/dL (ref 0.3–1.2)
Total Protein: 8.7 g/dL — ABNORMAL HIGH (ref 6.5–8.1)

## 2022-05-25 LAB — LIPASE, BLOOD: Lipase: 24 U/L (ref 11–51)

## 2022-05-25 MED ORDER — ALUM & MAG HYDROXIDE-SIMETH 200-200-20 MG/5ML PO SUSP
30.0000 mL | Freq: Once | ORAL | Status: AC
  Administered 2022-05-25: 30 mL via ORAL
  Filled 2022-05-25: qty 30

## 2022-05-25 MED ORDER — SUCRALFATE 1 G PO TABS: 1.0000 g | ORAL_TABLET | Freq: Four times a day (QID) | ORAL | 0 refills | Status: DC

## 2022-05-25 MED ORDER — ONDANSETRON HCL 4 MG/2ML IJ SOLN
4.0000 mg | Freq: Once | INTRAMUSCULAR | Status: AC
  Administered 2022-05-25: 4 mg via INTRAVENOUS
  Filled 2022-05-25: qty 2

## 2022-05-25 MED ORDER — IOHEXOL 350 MG/ML SOLN
100.0000 mL | Freq: Once | INTRAVENOUS | Status: AC | PRN
  Administered 2022-05-25: 100 mL via INTRAVENOUS

## 2022-05-25 MED ORDER — PANTOPRAZOLE SODIUM 40 MG PO TBEC: 40.0000 mg | DELAYED_RELEASE_TABLET | Freq: Every day | ORAL | 1 refills | Status: DC

## 2022-05-25 MED ORDER — SODIUM CHLORIDE 0.9 % IV BOLUS
1000.0000 mL | Freq: Once | INTRAVENOUS | Status: AC
  Administered 2022-05-25: 1000 mL via INTRAVENOUS

## 2022-05-25 MED ORDER — ONDANSETRON 4 MG PO TBDP: 4.0000 mg | ORAL_TABLET | Freq: Three times a day (TID) | ORAL | 0 refills | Status: DC | PRN

## 2022-05-25 MED ORDER — MORPHINE SULFATE (PF) 4 MG/ML IV SOLN
4.0000 mg | Freq: Once | INTRAVENOUS | Status: AC
  Administered 2022-05-25: 4 mg via INTRAVENOUS
  Filled 2022-05-25: qty 1

## 2022-05-25 NOTE — ED Notes (Signed)
CT called for open room to take pt to scan

## 2022-05-25 NOTE — ED Notes (Signed)
Pt transported to CT by this RN.

## 2022-05-25 NOTE — ED Notes (Signed)
RN at bedside. Pt oxygen saturation reading 79% on RA. Pt placed on 2L [place] with improvement to 96%. Pt states no improvement in symptoms.

## 2022-05-25 NOTE — ED Provider Notes (Signed)
[facility] Provider Note    Event Date/Time   First MD Initiated Contact with Patient 05/25/22 [phone]     (approximate)  History   Chief Complaint: Abdominal Pain  HPI  Tyler Bennett is a 51 y.o. male with no significant past medical history presents to the emergency department for nausea vomiting diarrhea and upper abdominal pain.  According to the patient over the past 2 days he has had upper abdominal pain nausea vomiting and diarrhea.  He states anytime he eats or drinks anything within 30 minutes he will be vomiting and often times with loose stool as well.  Patient denies any known fever.  Denies any urinary symptoms.  States minimal upper abdominal pain currently.  Physical Exam   Triage Vital Signs: ED Triage Vitals  Enc Vitals Group     BP 05/25/22 0851 (!) 111/93     Pulse Rate 05/25/22 0851 83     Resp 05/25/22 0851 19     Temp 05/25/22 0851 97.8 F (36.6 C)     Temp src --      SpO2 05/25/22 0851 100 %     Weight --      Height --      Head Circumference --      Peak Flow --      Pain Score 05/25/22 0850 3     Pain Loc --      Pain Edu? --      Excl. in [place]? --     Most recent vital signs: Vitals:   05/25/22 0851  BP: (!) 111/93  Pulse: 83  Resp: 19  Temp: 97.8 F (36.6 C)  SpO2: 100%    General: Awake, no distress.  CV:  Good peripheral perfusion.  Regular rate and rhythm  Resp:  Normal effort.  Equal breath sounds bilaterally.  Abd:  No distention.  Soft, mild epigastric tenderness, otherwise benign abdomen without rebound or guarding.  No right upper quadrant tenderness.   ED Results / Procedures / Treatments   RADIOLOGY  I have reviewed the CTA images I do not see any obvious aortic aneurysm or dissection. Radiology is read the CTA is negative for any aortic abnormality.  Patient does have esophageal thickening consistent with esophagitis.  MEDICATIONS ORDERED IN ED: Medications  morphine (PF) 4 MG/ML injection 4  mg (has no administration in time range)  ondansetron (ZOFRAN) injection 4 mg (has no administration in time range)  sodium chloride 0.9 % bolus 1,000 mL (has no administration in time range)     IMPRESSION / MDM / ASSESSMENT AND PLAN / ED COURSE  I reviewed the triage vital signs and the nursing notes.  Patient's presentation is most consistent with acute presentation with potential threat to life or bodily function.  Patient presents emergency department for nausea vomiting diarrhea and upper abdominal discomfort over the past 2 days.  States he only hurts when he eats or drinks anything and then often times will have abdominal pain and nausea vomiting as well as loose stool within 30 minutes of eating.  Denies any prior abdominal surgeries, still has his gallbladder and appendix.  On my examination patient has mild epigastric tenderness but no right upper quadrant tenderness.  Differential is quite broad but would include pancreatitis, biliary pathology, gastritis less likely partial SBO.  We will IV hydrate, treat pain nausea while obtaining lab work.  Patient's initial lab work shows significant leukocytosis of 22,000 I have ordered a CT scan  abdomen/pelvis to further evaluate.  Patient's urinalysis has resulted showing bilirubin, we will obtain a right upper quadrant ultrasound given the patient's upper abdominal pain and signs of bilirubin in the urine to evaluate for any biliary pathology.  Chemistry and lipase are pending  Chemistry still pending.  Patient is now complaining of increased pain in the abdomen sensation of tingling throughout his body and feeling like he is going to pass out.  We will proceed with a CTA scan of the chest abdomen pelvis to rule out dissection or aneurysm without waiting for chemistry results.  I have called CT and they are coming to get the patient.  CTA is negative besides esophagitis.  Patient given a GI cocktail and states all of his pain is now gone.   Highly suspect esophagitis and possibly gastritis to be the cause of the patient's discomfort.  Patient states he feels well denies any discomfort currently.  Patient does have mild renal insufficiency based on the labs with chemistry showing creatinine 1.6.  We will hydrate with a second liter of normal saline.  We will discharge with Protonix sucralfate and Zofran.  Patient agreeable to plan of care.  Will refer to GI medicine for an endoscopy to confirm esophagitis and to rule out esophageal neoplasm.  Patient agreeable to plan.  FINAL CLINICAL IMPRESSION(S) / ED DIAGNOSES   Abdominal pain Nausea vomiting Esophagitis  Note:  This document was prepared using Dragon voice recognition software and may include unintentional dictation errors.   Harvest Dark, MD 05/25/22 1243

## 2022-05-25 NOTE — ED Notes (Signed)
Pt A&O, IV removed, pt given discharge instructions, pt ambulating with steady gait. 

## 2022-05-25 NOTE — ED Notes (Signed)
This RN at bedside. Pt reports chills, nausea, increased thirst and generally feeling unwell. Pt is anxious, restless and pale in color    This RN was unaware pt was in room until call bell pulled. Pt was not placed on cardiac monitor not was RN notified pt was placed in room.

## 2022-05-25 NOTE — ED Triage Notes (Signed)
Pt comes with c/o abdominal pain and vomiting since yesterday. Pt stats left lower pain. Pt states anything hits his stomach it comes back up.   Pt states diarrhea. Pt states yellow vomit.

## 2022-05-26 ENCOUNTER — Encounter: Payer: Self-pay | Admitting: Emergency Medicine

## 2022-05-26 ENCOUNTER — Emergency Department: Payer: Commercial Managed Care - PPO

## 2022-05-26 ENCOUNTER — Other Ambulatory Visit: Payer: Self-pay

## 2022-05-26 ENCOUNTER — Emergency Department
Admission: EM | Admit: 2022-05-26 | Discharge: 2022-05-26 | Disposition: A | Discharge status: Home / Self Care | Payer: Commercial Managed Care - PPO | Attending: Emergency Medicine | Admitting: Emergency Medicine

## 2022-05-26 DIAGNOSIS — K209 Esophagitis, unspecified without bleeding: Secondary | ICD-10-CM | POA: Insufficient documentation

## 2022-05-26 DIAGNOSIS — R112 Nausea with vomiting, unspecified: Secondary | ICD-10-CM | POA: Diagnosis present

## 2022-05-26 DIAGNOSIS — F1210 Cannabis abuse, uncomplicated: Secondary | ICD-10-CM | POA: Diagnosis not present

## 2022-05-26 DIAGNOSIS — F1110 Opioid abuse, uncomplicated: Secondary | ICD-10-CM | POA: Diagnosis not present

## 2022-05-26 LAB — BASIC METABOLIC PANEL
Anion gap: 13 (ref 5–15)
BUN: 20 mg/dL (ref 6–20)
CO2: 19 mmol/L — ABNORMAL LOW (ref 22–32)
Calcium: 9.5 mg/dL (ref 8.9–10.3)
Chloride: 103 mmol/L (ref 98–111)
Creatinine, Ser: 1.29 mg/dL — ABNORMAL HIGH (ref 0.61–1.24)
GFR, Estimated: >60 mL/min (ref >60)
Glucose, Bld: 152 mg/dL — ABNORMAL HIGH (ref 70–99)
Potassium: 3.3 mmol/L — ABNORMAL LOW (ref 3.5–5.1)
Sodium: 135 mmol/L (ref 135–145)

## 2022-05-26 LAB — CBC
HCT: 45.4 % (ref 39.0–52.0)
Hemoglobin: 15.9 g/dL (ref 13.0–17.0)
MCH: 31.3 pg (ref 26.0–34.0)
MCHC: 35 g/dL (ref 30.0–36.0)
MCV: 89.4 fL (ref 80.0–100.0)
Platelets: 255 10*3/uL (ref 150–400)
RBC: 5.08 MIL/uL (ref 4.22–5.81)
RDW: 13.6 % (ref 11.5–15.5)
WBC: 14.4 10*3/uL — ABNORMAL HIGH (ref 4.0–10.5)
nRBC: 0 % (ref 0.0–0.2)

## 2022-05-26 LAB — URINALYSIS, ROUTINE W REFLEX MICROSCOPIC
Bilirubin Urine: NEGATIVE
Glucose, UA: NEGATIVE mg/dL
Ketones, ur: 20 mg/dL — AB
Leukocytes,Ua: NEGATIVE
Nitrite: NEGATIVE
Protein, ur: 100 mg/dL — AB
Specific Gravity, Urine: 1.038 — ABNORMAL HIGH (ref 1.005–1.030)
pH: 5 (ref 5.0–8.0)

## 2022-05-26 LAB — URINE DRUG SCREEN, QUALITATIVE (ARMC ONLY)
Amphetamines, Ur Screen: NOT DETECTED
Barbiturates, Ur Screen: NOT DETECTED
Benzodiazepine, Ur Scrn: NOT DETECTED
Cannabinoid 50 Ng, Ur ~~LOC~~: POSITIVE — AB
Cocaine Metabolite,Ur ~~LOC~~: NOT DETECTED
MDMA (Ecstasy)Ur Screen: NOT DETECTED
Methadone Scn, Ur: NOT DETECTED
Opiate, Ur Screen: POSITIVE — AB
Phencyclidine (PCP) Ur S: NOT DETECTED
Tricyclic, Ur Screen: NOT DETECTED

## 2022-05-26 LAB — TROPONIN I (HIGH SENSITIVITY): Troponin I (High Sensitivity): 8 ng/L (ref <18)

## 2022-05-26 MED ORDER — SODIUM CHLORIDE 0.9 % IV BOLUS
1000.0000 mL | Freq: Once | INTRAVENOUS | Status: AC
  Administered 2022-05-26: 1000 mL via INTRAVENOUS

## 2022-05-26 MED ORDER — ALUM & MAG HYDROXIDE-SIMETH 200-200-20 MG/5ML PO SUSP
30.0000 mL | Freq: Once | ORAL | Status: AC
  Administered 2022-05-26: 30 mL via ORAL
  Filled 2022-05-26: qty 30

## 2022-05-26 MED ORDER — METOCLOPRAMIDE HCL 10 MG PO TABS: 10.0000 mg | ORAL_TABLET | Freq: Three times a day (TID) | ORAL | 0 refills | Status: DC

## 2022-05-26 MED ORDER — LACTATED RINGERS IV BOLUS
1000.0000 mL | Freq: Once | INTRAVENOUS | Status: AC
  Administered 2022-05-26: 1000 mL via INTRAVENOUS

## 2022-05-26 MED ORDER — DROPERIDOL 2.5 MG/ML IJ SOLN
2.5000 mg | Freq: Once | INTRAMUSCULAR | Status: AC
  Administered 2022-05-26: 2.5 mg via INTRAVENOUS
  Filled 2022-05-26: qty 2

## 2022-05-26 MED ORDER — METOCLOPRAMIDE HCL 5 MG/ML IJ SOLN
10.0000 mg | Freq: Once | INTRAMUSCULAR | Status: AC
  Administered 2022-05-26: 10 mg via INTRAVENOUS
  Filled 2022-05-26: qty 2

## 2022-05-26 NOTE — ED Notes (Signed)
Developed more vomiting. Provider aware  additional meds ordered

## 2022-05-26 NOTE — ED Triage Notes (Signed)
Patient to ED via POV for chest pain and vomiting. Patient was seen here yesterday and diagnoses with esophagitis per wife. Patient actively vomiting in triage.

## 2022-05-26 NOTE — ED Notes (Signed)
States his stomach is still hurting  and has vomited again prior to this nurse going in room

## 2022-05-26 NOTE — ED Notes (Signed)
Denies any more vomiting   Taking sips of fluids  Tolerating well  Cont's to complain of stomach pain

## 2022-05-26 NOTE — ED Notes (Signed)
Resting at present

## 2022-05-26 NOTE — ED Provider Notes (Signed)
[facility] Provider Note   Event Date/Time   First MD Initiated Contact with Patient 05/26/22 [phone]     (approximate) History  Chest Pain  HPI Tyler Bennett is a 51 y.o. male  Location: Central chest Duration: 2 days prior to arrival Timing: Stable since onset Severity: 5/10 Quality: Burning pain Context: As he has been having nausea/vomiting over the last 2 days that is now progressed to severe burning central chest pain that does not radiate Modifying factors: Vomiting worsens this pain and Zofran does not relieve the symptoms Associated Symptoms: Nausea/vomiting ROS: Patient currently denies any vision changes, tinnitus, difficulty speaking, facial droop, sore throat, shortness of breath, abdominal pain, diarrhea, dysuria, or weakness/numbness/paresthesias in any extremity   Physical Exam  Triage Vital Signs: ED Triage Vitals  Enc Vitals Group     BP 05/26/22 0942 109/86     Pulse Rate 05/26/22 0942 72     Resp 05/26/22 0942 18     Temp 05/26/22 0942 98 F (36.7 C)     Temp Source 05/26/22 0942 Oral     SpO2 05/26/22 0942 100 %     Weight 05/26/22 0956 169 lb 15.6 oz (77.1 kg)     Height 05/26/22 0956 '5\' 8"'$  (1.727 m)     Head Circumference --      Peak Flow --      Pain Score 05/26/22 0940 5     Pain Loc --      Pain Edu? --      Excl. in [place]? --    Most recent vital signs: Vitals:   05/26/22 1134 05/26/22 1328  BP: 110/80 118/78  Pulse: 70 73  Resp: 18 16  Temp:    SpO2: 100% 100%   General: Awake, oriented x4. CV:  Good peripheral perfusion.  Resp:  Normal effort.  Abd:  No distention.  Other:  Middle-aged Caucasian male laying in bed in no acute distress ED Results / Procedures / Treatments  Labs (all labs ordered are listed, but only abnormal results are displayed) Labs Reviewed  BASIC METABOLIC PANEL - Abnormal; Notable for the following components:      Result Value   Potassium 3.3 (*)    CO2 19 (*)    Glucose, Bld 152  (*)    Creatinine, Ser 1.29 (*)    All other components within normal limits  CBC - Abnormal; Notable for the following components:   WBC 14.4 (*)    All other components within normal limits  URINALYSIS, ROUTINE W REFLEX MICROSCOPIC - Abnormal; Notable for the following components:   Color, Urine AMBER (*)    APPearance HAZY (*)    Specific Gravity, Urine 1.038 (*)    Hgb urine dipstick SMALL (*)    Ketones, ur 20 (*)    Protein, ur 100 (*)    Bacteria, UA RARE (*)    All other components within normal limits  URINE DRUG SCREEN, QUALITATIVE ([facility] ONLY) - Abnormal; Notable for the following components:   Opiate, Ur Screen POSITIVE (*)    Cannabinoid 50 Ng, Ur [place] POSITIVE (*)    All other components within normal limits  LIPASE, BLOOD  TROPONIN I (HIGH SENSITIVITY)   EKG ED ECG REPORT I, Naaman Plummer, the attending physician, personally viewed and interpreted this ECG. Date: 05/26/2022 EKG Time: 0941 Rate: 74 Rhythm: normal sinus rhythm QRS Axis: normal Intervals: normal ST/T Wave abnormalities: normal Narrative Interpretation: no evidence of acute ischemia RADIOLOGY  ED MD interpretation: 2 view chest x-ray interpreted by me shows no evidence of acute abnormalities including no pneumonia, pneumothorax, or widened mediastinum -Agree with radiology assessment Official radiology report(s): DG Chest 2 View  Result Date: 05/26/2022 CLINICAL DATA:  Chest pain, vomiting, diagnosed with esophagitis yesterday EXAM: CHEST - 2 VIEW COMPARISON:  05/28/2015 FINDINGS: Normal heart size, mediastinal contours, and pulmonary vascularity. Minimal chronic peribronchial thickening. Lungs clear. No pulmonary infiltrate, pleural effusion, or pneumothorax. No acute osseous findings. IMPRESSION: Minimal chronic bronchitic changes without acute infiltrate. Electronically Signed   By: Lavonia Dana M.D.   On: 05/26/2022 10:22   PROCEDURES: Critical Care performed: No .1-3 Lead EKG  Interpretation  Performed by: Naaman Plummer, MD Authorized by: Naaman Plummer, MD     Interpretation: normal     ECG rate:  72   ECG rate assessment: normal     Rhythm: sinus rhythm     Ectopy: none     Conduction: normal    MEDICATIONS ORDERED IN ED: Medications  sodium chloride 0.9 % bolus 1,000 mL (0 mLs Intravenous Stopped 05/26/22 1314)  droperidol (INAPSINE) 2.5 MG/ML injection 2.5 mg (2.5 mg Intravenous Given 05/26/22 1025)  alum & mag hydroxide-simeth (MAALOX/MYLANTA) 200-200-20 MG/5ML suspension 30 mL (30 mLs Oral Given 05/26/22 1131)  metoCLOPramide (REGLAN) injection 10 mg (10 mg Intravenous Given 05/26/22 1151)  lactated ringers bolus 1,000 mL (0 mLs Intravenous Stopped 05/26/22 1327)   IMPRESSION / MDM / ASSESSMENT AND PLAN / ED COURSE  I reviewed the triage vital signs and the nursing notes.                             The patient is on the cardiac monitor to evaluate for evidence of arrhythmia and/or significant heart rate changes. Patient's presentation is most consistent with acute presentation with potential threat to life or bodily function. Patient presents for acute nausea/vomiting The cause of the patients symptoms is not clear, but the patient is overall well appearing and is suspected to have a transient course of illness.  Given History and Exam there does not appear to be an emergent cause of the symptoms such as small bowel obstruction, coronary syndrome, bowel ischemia, DKA, pancreatitis, appendicitis, other acute abdomen or other emergent problem.  Given positive urine drug screen for cannabis, discussed with patient the importance of cessation given possible cannabinoid hyperemesis syndrome.  Also discussed the importance of using capsaicin cream as well as continued scheduled use of Reglan  Reassessment: After treatment, the patient is feeling much better, tolerating PO fluids, and shows no signs of dehydration.   Disposition: Discharge home with prompt  primary care physician follow up in the next 48 hours. Strict return precautions discussed.   FINAL CLINICAL IMPRESSION(S) / ED DIAGNOSES   Final diagnoses:  Nausea and vomiting, unspecified vomiting type  Esophagitis   Rx / DC Orders   ED Discharge Orders          Ordered    metoCLOPramide (REGLAN) 10 MG tablet  3 times daily with meals        05/26/22 1338           Note:  This document was prepared using Dragon voice recognition software and may include unintentional dictation errors.   Naaman Plummer, MD 05/26/22 1349

## 2022-06-02 ENCOUNTER — Other Ambulatory Visit: Payer: Self-pay

## 2022-06-02 ENCOUNTER — Encounter: Payer: Self-pay | Admitting: Medical Oncology

## 2022-06-02 ENCOUNTER — Emergency Department
Admission: EM | Admit: 2022-06-02 | Discharge: 2022-06-02 | Disposition: A | Discharge status: Home / Self Care | Payer: Commercial Managed Care - PPO | Attending: Emergency Medicine | Admitting: Emergency Medicine

## 2022-06-02 DIAGNOSIS — D72829 Elevated white blood cell count, unspecified: Secondary | ICD-10-CM | POA: Diagnosis not present

## 2022-06-02 DIAGNOSIS — R197 Diarrhea, unspecified: Secondary | ICD-10-CM | POA: Diagnosis not present

## 2022-06-02 DIAGNOSIS — R1012 Left upper quadrant pain: Secondary | ICD-10-CM | POA: Diagnosis not present

## 2022-06-02 DIAGNOSIS — R112 Nausea with vomiting, unspecified: Secondary | ICD-10-CM | POA: Insufficient documentation

## 2022-06-02 LAB — CBC WITH DIFFERENTIAL/PLATELET
Abs Immature Granulocytes: 0.07 10*3/uL (ref 0.00–0.07)
Basophils Absolute: 0.1 10*3/uL (ref 0.0–0.1)
Basophils Relative: 1 %
Eosinophils Absolute: 0.1 10*3/uL (ref 0.0–0.5)
Eosinophils Relative: 1 %
HCT: 50.3 % (ref 39.0–52.0)
Hemoglobin: 17.5 g/dL — ABNORMAL HIGH (ref 13.0–17.0)
Immature Granulocytes: 1 %
Lymphocytes Relative: 26 %
Lymphs Abs: 3.2 10*3/uL (ref 0.7–4.0)
MCH: 31 pg (ref 26.0–34.0)
MCHC: 34.8 g/dL (ref 30.0–36.0)
MCV: 89 fL (ref 80.0–100.0)
Monocytes Absolute: 1 10*3/uL (ref 0.1–1.0)
Monocytes Relative: 8 %
Neutro Abs: 7.7 10*3/uL (ref 1.7–7.7)
Neutrophils Relative %: 63 %
Platelets: 344 10*3/uL (ref 150–400)
RBC: 5.65 MIL/uL (ref 4.22–5.81)
RDW: 13 % (ref 11.5–15.5)
WBC: 12.3 10*3/uL — ABNORMAL HIGH (ref 4.0–10.5)
nRBC: 0 % (ref 0.0–0.2)
nRBC: 0 /100 WBC

## 2022-06-02 LAB — GASTROINTESTINAL PANEL BY PCR, STOOL (REPLACES STOOL CULTURE)

## 2022-06-02 LAB — COMPREHENSIVE METABOLIC PANEL
ALT: 17 U/L (ref 0–44)
AST: 23 U/L (ref 15–41)
Albumin: 4.7 g/dL (ref 3.5–5.0)
Alkaline Phosphatase: 81 U/L (ref 38–126)
Anion gap: 9 (ref 5–15)
BUN: 19 mg/dL (ref 6–20)
CO2: 26 mmol/L (ref 22–32)
Calcium: 9.6 mg/dL (ref 8.9–10.3)
Chloride: 100 mmol/L (ref 98–111)
Creatinine, Ser: 1.26 mg/dL — ABNORMAL HIGH (ref 0.61–1.24)
GFR, Estimated: >60 mL/min (ref >60)
Glucose, Bld: 109 mg/dL — ABNORMAL HIGH (ref 70–99)
Potassium: 3.9 mmol/L (ref 3.5–5.1)
Sodium: 135 mmol/L (ref 135–145)
Total Bilirubin: 1.3 mg/dL — ABNORMAL HIGH (ref 0.3–1.2)
Total Protein: 7.8 g/dL (ref 6.5–8.1)

## 2022-06-02 LAB — URINALYSIS, ROUTINE W REFLEX MICROSCOPIC
Bacteria, UA: NONE SEEN
Bilirubin Urine: NEGATIVE
Glucose, UA: NEGATIVE mg/dL
Hgb urine dipstick: NEGATIVE
Ketones, ur: NEGATIVE mg/dL
Leukocytes,Ua: NEGATIVE
Nitrite: NEGATIVE
Protein, ur: 30 mg/dL — AB
Specific Gravity, Urine: 1.027 (ref 1.005–1.030)
pH: 6 (ref 5.0–8.0)

## 2022-06-02 LAB — CBC
HCT: 50.4 % (ref 39.0–52.0)
Hemoglobin: 17.3 g/dL — ABNORMAL HIGH (ref 13.0–17.0)
MCH: 30.5 pg (ref 26.0–34.0)
MCHC: 34.3 g/dL (ref 30.0–36.0)
MCV: 88.9 fL (ref 80.0–100.0)
Platelets: 342 10*3/uL (ref 150–400)
RBC: 5.67 MIL/uL (ref 4.22–5.81)
RDW: 13 % (ref 11.5–15.5)
WBC: 12.3 10*3/uL — ABNORMAL HIGH (ref 4.0–10.5)
nRBC: 0 % (ref 0.0–0.2)

## 2022-06-02 LAB — C DIFFICILE QUICK SCREEN W PCR REFLEX
C Diff antigen: NEGATIVE
C Diff interpretation: NOT DETECTED
C Diff toxin: NEGATIVE

## 2022-06-02 LAB — LIPASE, BLOOD: Lipase: 42 U/L (ref 11–51)

## 2022-06-02 MED ORDER — LACTATED RINGERS IV BOLUS
1000.0000 mL | Freq: Once | INTRAVENOUS | Status: AC
  Administered 2022-06-02: 1000 mL via INTRAVENOUS

## 2022-06-02 MED ORDER — PANTOPRAZOLE SODIUM 40 MG IV SOLR
40.0000 mg | Freq: Once | INTRAVENOUS | Status: AC
Start: 2022-06-02 — End: 2022-06-02
  Administered 2022-06-02: 40 mg via INTRAVENOUS
  Filled 2022-06-02: qty 10

## 2022-06-02 MED ORDER — SUCRALFATE 1 GM/10ML PO SUSP
1.0000 g | ORAL | Status: AC
  Administered 2022-06-02: 1 g via ORAL
  Filled 2022-06-02: qty 10

## 2022-06-02 MED ORDER — ONDANSETRON 4 MG PO TBDP: 4.0000 mg | ORAL_TABLET | Freq: Three times a day (TID) | ORAL | 0 refills | Status: AC | PRN

## 2022-06-02 MED ORDER — ONDANSETRON HCL 4 MG/2ML IJ SOLN
4.0000 mg | Freq: Once | INTRAMUSCULAR | Status: AC
Start: 2022-06-02 — End: 2022-06-02
  Administered 2022-06-02: 4 mg via INTRAVENOUS
  Filled 2022-06-02: qty 2

## 2022-06-02 MED ORDER — SUCRALFATE 1 GM/10ML PO SUSP: 1.0000 g | Freq: Four times a day (QID) | ORAL | 0 refills | Status: DC

## 2022-06-02 MED ORDER — ACETAMINOPHEN 500 MG PO TABS
1000.0000 mg | ORAL_TABLET | Freq: Once | ORAL | Status: AC
  Administered 2022-06-02: 1000 mg via ORAL
  Filled 2022-06-02: qty 2

## 2022-06-02 MED ORDER — METOCLOPRAMIDE HCL 5 MG/ML IJ SOLN
10.0000 mg | Freq: Once | INTRAMUSCULAR | Status: AC
  Administered 2022-06-02: 10 mg via INTRAVENOUS
  Filled 2022-06-02: qty 2

## 2022-06-02 MED ORDER — PANTOPRAZOLE SODIUM 40 MG PO PACK: 40.0000 mg | PACK | Freq: Every day | ORAL | 0 refills | Status: DC

## 2022-06-02 MED ORDER — ALUM & MAG HYDROXIDE-SIMETH 200-200-20 MG/5ML PO SUSP
30.0000 mL | Freq: Once | ORAL | Status: AC
  Administered 2022-06-02: 30 mL via ORAL
  Filled 2022-06-02: qty 30

## 2022-06-02 NOTE — ED Triage Notes (Signed)
Pt to ED with reports of vomiting and diarrhea, began 2 Sundays ago. Pt reports the day before all of this started he had a tick bite. Pt reports unable to keep anything down. Denies fever.

## 2022-06-02 NOTE — ED Notes (Signed)
See triage note  Presents with abd pain  states he has had this pain with n/v/d for several weeks  Denies any fever  Has been seen 3 times prior for same Last diarrhea was couple of days ago

## 2022-06-02 NOTE — ED Provider Notes (Signed)
[facility] Provider Note    Event Date/Time   First MD Initiated Contact with Patient 06/02/22 [phone]     (approximate)   History   Emesis and Diarrhea   HPI  Tyler Bennett is a 51 y.o. male with a past medical history of tobacco abuse who presents for evaluation of ongoing nausea and vomiting and some crampy left upper quadrant abdominal pain.  Patient states this is the fourth ED visit he has had in a little over 1 week.  States he was prescribed some nausea medicine does not feel this is helped significantly.  States he has not had any diarrhea in a little over a week.  His abdominal pain is worse whenever he tries to eat or drink anything is primarily centered around his left upper quadrant.  No fevers, urinary symptoms, back pain, chest pain, cough patient states he has had a little shortness of breath over the last couple days unchanged from when he was last evaluated for this.  He notes he has a mass in his right upper arm and has been there for decades and is pending an outpatient dermatology referral for this.  He denies any recent EtOH use.  Patient does note that he was out swimming at [place] the before all symptoms started.  He also notes that before the symptoms started he pulled a tick off his left upper leg did not have any rash.  He is not sure how long the tick was present for.  I reviewed recent outside hospital ED visit CT imaging interpretation was remarkable for no emergent findings although there is notation of underdistention of the bladder and indeterminate hepatic lesion possibly absent hemangioma    History reviewed. No pertinent past medical history.   Physical Exam  Triage Vital Signs: ED Triage Vitals [06/02/22 0802]  Enc Vitals Group     BP 121/89     Pulse Rate 89     Resp 16     Temp 98.2 F (36.8 C)     Temp Source Oral     SpO2 98 %     Weight 55 lb 1.8 oz (25 kg)     Height '5\' 8"'$  (1.727 m)     Head Circumference       Peak Flow      Pain Score 4     Pain Loc      Pain Edu?      Excl. in [place]?     Most recent vital signs: Vitals:   06/02/22 0802 06/02/22 1112  BP: 121/89 122/88  Pulse: 89 88  Resp: 16 16  Temp: 98.2 F (36.8 C) 98 F (36.7 C)  SpO2: 98% 98%    General: Awake, no distress.  CV:  Good peripheral perfusion.  2+ radial pulses Resp:  Normal effort.  Clear bilaterally. Abd:  No distention.  Soft throughout.  No CVA tenderness. Other:  Trimix membranes.   ED Results / Procedures / Treatments  Labs (all labs ordered are listed, but only abnormal results are displayed) Labs Reviewed  COMPREHENSIVE METABOLIC PANEL - Abnormal; Notable for the following components:      Result Value   Glucose, Bld 109 (*)    Creatinine, Ser 1.26 (*)    Total Bilirubin 1.3 (*)    All other components within normal limits  CBC - Abnormal; Notable for the following components:   WBC 12.3 (*)    Hemoglobin 17.3 (*)    All  other components within normal limits  URINALYSIS, ROUTINE W REFLEX MICROSCOPIC - Abnormal; Notable for the following components:   Color, Urine YELLOW (*)    APPearance HAZY (*)    Protein, ur 30 (*)    All other components within normal limits  CBC WITH DIFFERENTIAL/PLATELET - Abnormal; Notable for the following components:   WBC 12.3 (*)    Hemoglobin 17.5 (*)    All other components within normal limits  C DIFFICILE QUICK SCREEN W PCR REFLEX    GASTROINTESTINAL PANEL BY PCR, STOOL (REPLACES STOOL CULTURE)  LIPASE, BLOOD  H. PYLORI ANTIGEN, STOOL  DIFFERENTIAL     EKG   RADIOLOGY    PROCEDURES:  Critical Care performed: No  Procedures    MEDICATIONS ORDERED IN ED: Medications  lactated ringers bolus 1,000 mL (0 mLs Intravenous Stopped 06/02/22 1134)  ondansetron (ZOFRAN) injection 4 mg (4 mg Intravenous Given 06/02/22 0908)  pantoprazole (PROTONIX) injection 40 mg (40 mg Intravenous Given 06/02/22 0921)  alum & mag hydroxide-simeth (MAALOX/MYLANTA)  200-200-20 MG/5ML suspension 30 mL (30 mLs Oral Given 06/02/22 1135)  sucralfate (CARAFATE) 1 GM/10ML suspension 1 g (1 g Oral Given 06/02/22 1159)  acetaminophen (TYLENOL) tablet 1,000 mg (1,000 mg Oral Given 06/02/22 1134)  metoCLOPramide (REGLAN) injection 10 mg (10 mg Intravenous Given 06/02/22 1158)     IMPRESSION / MDM / ASSESSMENT AND PLAN / ED COURSE  I reviewed the triage vital signs and the nursing notes. Patient's presentation is most consistent with acute presentation with potential threat to life or bodily function.                               Differential diagnosis includes, but is not limited to infectious gastroenteritis possibly associated with electrolyte derangements and kidney injury versus possible H. pylori gastritis, peptic ulcer disease, gastroparesis.  I reviewed patient's recent ED visits including on 7/10, 7/11 and 7/13 but all had fairly reassuring work-ups including a CTA dissection study chest and pelvis done on 710 when symptoms initially started and was largely unremarkable for acute process.   CMP today shows improving kidney function with creatinine of 1.26 compared to 1.297 days ago with a bili of 1.3 but no other significant electrolyte or metabolic derangements.  No evidence of acute hepatitis or cholestatic process.  CBC shows WBC count 12.3 compared to 14.47 days ago with hemoglobin of 7.3 and normal platelets.  C. difficile screen is negative.  UA has some protein but otherwise does not appear infected.  GI pathogen H. pylori studies sent.  Patient is able to tolerate some p.o. feeling little better after above analgesia.  He is requesting liquid formulation of antacids and ODT Zofran which were provided.  Emphasized importance of close outpatient PCP and GI follow-up.  Advised that he can follow-up his GI pathogen panel and H. pylori studies with his PCP GI or on MyChart.  Discharged in stable condition.  Strict return precautions advised and discussed.        FINAL CLINICAL IMPRESSION(S) / ED DIAGNOSES   Final diagnoses:  LUQ pain  Nausea and vomiting, unspecified vomiting type     Rx / DC Orders   ED Discharge Orders          Ordered    sucralfate (CARAFATE) 1 GM/10ML suspension  4 times daily        06/02/22 1142    pantoprazole sodium (PROTONIX) 40 mg  Daily  06/02/22 1306    ondansetron (ZOFRAN-ODT) 4 MG disintegrating tablet  Every 8 hours PRN        06/02/22 1307             Note:  This document was prepared using Dragon voice recognition software and may include unintentional dictation errors.   Lucrezia Starch, MD 06/02/22 1309

## 2022-06-04 LAB — H. PYLORI ANTIGEN, STOOL: H. Pylori Stool Ag, Eia: NEGATIVE

## 2022-06-20 ENCOUNTER — Encounter: Payer: Self-pay | Admitting: Physician Assistant

## 2022-06-20 ENCOUNTER — Ambulatory Visit
Admission: EM | Admit: 2022-06-20 | Discharge: 2022-06-20 | Disposition: A | Discharge status: Home / Self Care | Payer: Commercial Managed Care - PPO | Attending: Physician Assistant | Admitting: Physician Assistant

## 2022-06-20 DIAGNOSIS — L0291 Cutaneous abscess, unspecified: Secondary | ICD-10-CM | POA: Diagnosis not present

## 2022-06-20 MED ORDER — DOXYCYCLINE HYCLATE 100 MG PO CAPS: 100.0000 mg | ORAL_CAPSULE | Freq: Two times a day (BID) | ORAL | 0 refills | Status: DC

## 2022-06-20 NOTE — ED Triage Notes (Signed)
Pt presents with infected skin ulcer on back of left leg for over a week.

## 2022-06-20 NOTE — ED Provider Notes (Signed)
[name]    CSN: [id number] Arrival date & time: 06/20/22  1532      History   Chief Complaint Chief Complaint  Patient presents with   Puncture Wound    HPI Tyler Bennett is a 51 y.o. male.   Patient here today for evaluation of skin infection to the back of his left lower leg that he first noticed a week ago. He reports area continues to get more red, swollen and painful. Bearing weight now causes pain as well.   The history is provided by the patient.  Abscess Associated symptoms: no fever and no vomiting     History reviewed. No pertinent past medical history.  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 Authorizing Provider  doxycycline (VIBRAMYCIN) 100 MG capsule Take 1 capsule (100 mg total) by mouth 2 (two) times daily. 06/20/22  Yes Francene Finders, PA-C  pantoprazole sodium (PROTONIX) 40 mg Take 40 mg by mouth daily for 14 days. 06/02/22 06/16/22  Lucrezia Starch, MD  promethazine (PHENERGAN) 25 MG suppository Place 1 suppository (25 mg total) rectally every 6 (six) hours as needed for nausea or vomiting. 05/26/15   Annita Brod, MD  sucralfate (CARAFATE) 1 GM/10ML suspension Take 10 mLs (1 g total) by mouth 4 (four) times daily for 3 days. 06/02/22 06/05/22  Lucrezia Starch, MD  zolpidem (AMBIEN) 5  MG tablet Take 1 tablet (5 mg total) by mouth at bedtime as needed for sleep. 05/28/15   Charlesetta Shanks, MD    Family History Family History  Problem Relation Age of Onset   Diabetes type II Mother    Heart failure Mother    Hyperlipidemia Father    CAD Other     Social History Social History   Tobacco Use   Smoking status: Every Day  Substance Use Topics   Alcohol use: No   Drug use: No     Allergies   Patient has no known allergies.   Review of Systems Review of Systems  Constitutional:  Negative for chills and fever.  Eyes:  Negative for discharge and redness.  Respiratory:  Negative for shortness of breath.   Gastrointestinal:  Negative for vomiting.  Skin:  Positive for color change and wound.  Neurological:  Negative for numbness.     Physical Exam Triage Vital Signs ED Triage Vitals  Enc Vitals Group     BP      Pulse      Resp      Temp      Temp src      SpO2      Weight      Height      Head Circumference      Peak Flow      Pain Score      Pain  Loc      Pain Edu?      Excl. in [place]?    No data found.  Updated Vital Signs BP (!) 130/90 (BP Location: Left Arm)   Pulse (!) 110   Temp 98.2 F (36.8 C) (Oral)   Resp 18   SpO2 96%       Physical Exam Vitals and nursing note reviewed.  Constitutional:      General: He is not in acute distress.    Appearance: Normal appearance. He is not ill-appearing.  HENT:     Head: Normocephalic and atraumatic.  Eyes:     Conjunctiva/sclera: Conjunctivae normal.  Cardiovascular:     Rate and Rhythm: Normal rate.  Pulmonary:     Effort: Pulmonary effort is normal. No respiratory distress.  Skin:    Comments: Approx 1 cm ulceration to left posterior calf with surrounding erythema, no active drainage or bleeding  Neurological:     Mental Status: He is alert.  Psychiatric:        Mood and Affect: Mood normal.        Behavior: Behavior normal.        Thought Content: Thought content normal.       UC Treatments / Results  Labs (all labs ordered are listed, but only abnormal results are displayed) Labs Reviewed - No data to display  EKG   Radiology No results found.  Procedures Procedures (including critical care time)  Medications Ordered in UC Medications - No data to display  Initial Impression / Assessment and Plan / UC Course  I have reviewed the triage vital signs and the nursing notes.  Pertinent labs & imaging results that were available during my care of the patient were reviewed by me and considered in my medical decision making (see chart for details).    Doxycycline prescribed. Recommended follow up with any further concerns. Advised ED if symptoms do not improve in 48 hours or worsen.   Final Clinical Impressions(s) / UC Diagnoses   Final diagnoses:  Abscess   Discharge Instructions   None    ED Prescriptions     Medication Sig Dispense Auth. Provider   doxycycline (VIBRAMYCIN) 100 MG capsule Take 1 capsule (100 mg total) by mouth 2 (two) times daily. 20 capsule Francene Finders, PA-C      PDMP not reviewed this encounter.   Francene Finders, PA-C 06/20/22 1551

## 2022-06-22 ENCOUNTER — Encounter: Payer: Self-pay | Admitting: Emergency Medicine

## 2022-06-22 ENCOUNTER — Other Ambulatory Visit: Payer: Self-pay

## 2022-06-22 ENCOUNTER — Emergency Department
Admission: EM | Admit: 2022-06-22 | Discharge: 2022-06-22 | Disposition: A | Discharge status: Home / Self Care | Payer: Commercial Managed Care - PPO | Attending: Emergency Medicine | Admitting: Emergency Medicine

## 2022-06-22 DIAGNOSIS — L03116 Cellulitis of left lower limb: Secondary | ICD-10-CM

## 2022-06-22 DIAGNOSIS — L02416 Cutaneous abscess of left lower limb: Secondary | ICD-10-CM | POA: Insufficient documentation

## 2022-06-22 DIAGNOSIS — R2242 Localized swelling, mass and lump, left lower limb: Secondary | ICD-10-CM | POA: Diagnosis present

## 2022-06-22 LAB — BASIC METABOLIC PANEL
Anion gap: 8 (ref 5–15)
BUN: 15 mg/dL (ref 6–20)
CO2: 23 mmol/L (ref 22–32)
Calcium: 9.4 mg/dL (ref 8.9–10.3)
Chloride: 106 mmol/L (ref 98–111)
Creatinine, Ser: 1.02 mg/dL (ref 0.61–1.24)
GFR, Estimated: >60 mL/min (ref >60)
Glucose, Bld: 107 mg/dL — ABNORMAL HIGH (ref 70–99)
Potassium: 4.1 mmol/L (ref 3.5–5.1)
Sodium: 137 mmol/L (ref 135–145)

## 2022-06-22 LAB — CBC WITH DIFFERENTIAL/PLATELET
Abs Immature Granulocytes: 0.06 10*3/uL (ref 0.00–0.07)
Basophils Absolute: 0.1 10*3/uL (ref 0.0–0.1)
Basophils Relative: 1 %
Eosinophils Absolute: 0.2 10*3/uL (ref 0.0–0.5)
Eosinophils Relative: 2 %
HCT: 44.3 % (ref 39.0–52.0)
Hemoglobin: 14.8 g/dL (ref 13.0–17.0)
Immature Granulocytes: 1 %
Lymphocytes Relative: 19 %
Lymphs Abs: 2.3 10*3/uL (ref 0.7–4.0)
MCH: 31 pg (ref 26.0–34.0)
MCHC: 33.4 g/dL (ref 30.0–36.0)
MCV: 92.9 fL (ref 80.0–100.0)
Monocytes Absolute: 0.8 10*3/uL (ref 0.1–1.0)
Monocytes Relative: 7 %
Neutro Abs: 9 10*3/uL — ABNORMAL HIGH (ref 1.7–7.7)
Neutrophils Relative %: 70 %
Platelets: 312 10*3/uL (ref 150–400)
RBC: 4.77 MIL/uL (ref 4.22–5.81)
RDW: 13.6 % (ref 11.5–15.5)
WBC: 12.5 10*3/uL — ABNORMAL HIGH (ref 4.0–10.5)
nRBC: 0 % (ref 0.0–0.2)

## 2022-06-22 MED ORDER — LIDOCAINE HCL (PF) 1 % IJ SOLN
5.0000 mL | Freq: Once | INTRAMUSCULAR | Status: AC
  Administered 2022-06-22: 5 mL
  Filled 2022-06-22: qty 5

## 2022-06-22 NOTE — ED Triage Notes (Signed)
Pt to ED via POV, pt has abscess on the left lower leg. Pt states that the area came up last Monday while [place] the beach. Pt states that he was seen at Urgent Care on 8/5 and was given antibiotics. Pt states that the area has gotten worse since being on abx. Pt states that the area is more painful and his leg is more swollen. Pt VSS at this time.

## 2022-06-22 NOTE — ED Provider Notes (Signed)
[facility] Emergency Department Provider Note     Event Date/Time   First MD Initiated Contact with Patient 06/22/22 1242     (approximate)   History   Insect Bite   HPI  Tyler Bennett is a 51 y.o. male With a history of bradycardia presents to the ED for evaluation of an abscess to his left lower leg.  Patient reports area initially showed up a week ago on Monday while [place] was [place] the beach.  He was seen 2 days ago at local urgent care and started on antibiotics.  Patient reports symptoms have progressed despite being on doxycycline.  He reports the area is tender to touch and his leg overall appears more swollen.  He denies any fevers, chills, sweats but also denies any purulent drainage.    Physical Exam   Triage Vital Signs: ED Triage Vitals  Enc Vitals Group     BP 06/22/22 1213 (!) 133/92     Pulse Rate 06/22/22 1212 88     Resp 06/22/22 1212 16     Temp 06/22/22 1212 98.7 F (37.1 C)     Temp Source 06/22/22 1212 Oral     SpO2 06/22/22 1212 97 %     Weight --      Height --      Head Circumference --      Peak Flow --      Pain Score 06/22/22 1213 7     Pain Loc --      Pain Edu? --      Excl. in [place]? --     Most recent vital signs: Vitals:   06/22/22 1213 06/22/22 1402  BP: (!) 133/92 130/88  Pulse:  87  Resp:  16  Temp:    SpO2:  98%    General Awake, no distress.  CV:  Good peripheral perfusion.  RESP:  Normal effort.  ABD:  No distention.  SKIN:  LLE with posterior pustule measuring approximately 1 cm with some surrounding duration, warmth, and erythema.   ED Results / Procedures / Treatments   Labs (all labs ordered are listed, but only abnormal results are displayed) Labs Reviewed  CBC WITH DIFFERENTIAL/PLATELET - Abnormal; Notable for the following components:      Result Value   WBC 12.5 (*)    Neutro Abs 9.0 (*)    All other components within normal limits  BASIC METABOLIC PANEL - Abnormal; Notable for the  following components:   Glucose, Bld 107 (*)    All other components within normal limits     EKG   RADIOLOGY   No results found.   PROCEDURES:  Critical Care performed: No  ..Incision and Drainage  Date/Time: 06/22/2022 1:30 PM  Performed by: Melvenia Needles, PA-C Authorized by: Melvenia Needles, PA-C   Consent:    Consent obtained:  Verbal   Consent given by:  Patient   Risks, benefits, and alternatives were discussed: yes     Risks discussed:  Bleeding, incomplete drainage, infection and pain   Alternatives discussed:  Delayed treatment Universal protocol:    Site/side marked: yes     Patient identity confirmed:  Verbally with patient Location:    Type:  Abscess   Size:  3   Location:  Lower extremity   Lower extremity location:  Leg   Leg location:  L lower leg Pre-procedure details:    Skin preparation:  Povidone-iodine Sedation:    Sedation  type:  None Anesthesia:    Anesthesia method:  Local infiltration   Local anesthetic:  Lidocaine 1% w/o epi Procedure type:    Complexity:  Simple Procedure details:    Ultrasound guidance: no     Needle aspiration: no     Incision types:  Single straight   Incision depth:  Subcutaneous   Wound management:  Probed and deloculated and irrigated with saline   Drainage:  Purulent   Drainage amount:  Moderate   Wound treatment:  Wound left open   Packing materials:  None Post-procedure details:    Procedure completion:  Tolerated well, no immediate complications    MEDICATIONS ORDERED IN ED: Medications  lidocaine (PF) (XYLOCAINE) 1 % injection 5 mL (5 mLs Infiltration Given by Other 06/22/22 1346)     IMPRESSION / MDM / ASSESSMENT AND PLAN / ED COURSE  I reviewed the triage vital signs and the nursing notes.                              Differential diagnosis includes, but is not limited to, cellulitis, abscess, infected insect bite, contact dermatitis  Patient's presentation is most  consistent with acute complicated illness / injury requiring diagnostic workup.  Patient's diagnosis is consistent with local abscess with surrounding cellulitis.  She agrees to a local I&D procedure which was performed with good expression of moderate amount of purulent drainage.  Patient will be discharged home with directions to continue his previously prescribed antibiotic. Patient is to follow up with his PCP or primary provider as needed or otherwise directed. Patient is given ED precautions to return to the ED for any worsening or new symptoms.     FINAL CLINICAL IMPRESSION(S) / ED DIAGNOSES   Final diagnoses:  Cellulitis of left lower extremity     Rx / DC Orders   ED Discharge Orders     None        Note:  This document was prepared using Dragon voice recognition software and may include unintentional dictation errors.    Melvenia Needles, PA-C 06/22/22 1942    Harvest Dark, MD 06/23/22 1515

## 2022-06-22 NOTE — Discharge Instructions (Addendum)
Keep the wound clean, dry, and covered.  Apply warm compress to help promote healing.  Follow-up with primary provider for this ED for worsening signs of infection.  Continue with the previously prescribed antibiotic.

## 2022-06-22 NOTE — ED Notes (Signed)
See triage note  Presents with redness to posterior left lower leg  States possible bug bite from several days ago

## 2022-11-21 ENCOUNTER — Emergency Department (HOSPITAL_COMMUNITY): Payer: Commercial Managed Care - PPO

## 2022-11-21 ENCOUNTER — Observation Stay (HOSPITAL_COMMUNITY)
Admission: EM | Admit: 2022-11-21 | Discharge: 2022-11-24 | Discharge status: Against Medical Advice | Payer: Commercial Managed Care - PPO | Attending: Internal Medicine | Admitting: Internal Medicine

## 2022-11-21 ENCOUNTER — Emergency Department
Admission: EM | Admit: 2022-11-21 | Discharge: 2022-11-21 | Discharge status: Against Medical Advice | Payer: Commercial Managed Care - PPO | Attending: Emergency Medicine | Admitting: Emergency Medicine

## 2022-11-21 ENCOUNTER — Encounter (HOSPITAL_COMMUNITY): Payer: Self-pay | Admitting: Internal Medicine

## 2022-11-21 ENCOUNTER — Ambulatory Visit
Admission: EM | Admit: 2022-11-21 | Discharge: 2022-11-21 | Disposition: A | Discharge status: Home / Self Care | Payer: Commercial Managed Care - PPO

## 2022-11-21 ENCOUNTER — Other Ambulatory Visit: Payer: Self-pay

## 2022-11-21 DIAGNOSIS — K5900 Constipation, unspecified: Secondary | ICD-10-CM | POA: Diagnosis not present

## 2022-11-21 DIAGNOSIS — Z5321 Procedure and treatment not carried out due to patient leaving prior to being seen by health care provider: Secondary | ICD-10-CM | POA: Insufficient documentation

## 2022-11-21 DIAGNOSIS — Z1152 Encounter for screening for COVID-19: Secondary | ICD-10-CM

## 2022-11-21 DIAGNOSIS — R34 Anuria and oliguria: Secondary | ICD-10-CM | POA: Diagnosis present

## 2022-11-21 DIAGNOSIS — R739 Hyperglycemia, unspecified: Secondary | ICD-10-CM | POA: Diagnosis not present

## 2022-11-21 DIAGNOSIS — Z79899 Other long term (current) drug therapy: Secondary | ICD-10-CM | POA: Diagnosis not present

## 2022-11-21 DIAGNOSIS — E872 Acidosis, unspecified: Secondary | ICD-10-CM

## 2022-11-21 DIAGNOSIS — N179 Acute kidney failure, unspecified: Secondary | ICD-10-CM | POA: Diagnosis not present

## 2022-11-21 DIAGNOSIS — E861 Hypovolemia: Secondary | ICD-10-CM | POA: Diagnosis not present

## 2022-11-21 DIAGNOSIS — F17210 Nicotine dependence, cigarettes, uncomplicated: Secondary | ICD-10-CM | POA: Diagnosis present

## 2022-11-21 DIAGNOSIS — R079 Chest pain, unspecified: Secondary | ICD-10-CM | POA: Diagnosis not present

## 2022-11-21 DIAGNOSIS — R109 Unspecified abdominal pain: Secondary | ICD-10-CM | POA: Diagnosis present

## 2022-11-21 DIAGNOSIS — F1290 Cannabis use, unspecified, uncomplicated: Secondary | ICD-10-CM | POA: Diagnosis present

## 2022-11-21 DIAGNOSIS — R112 Nausea with vomiting, unspecified: Secondary | ICD-10-CM | POA: Diagnosis not present

## 2022-11-21 DIAGNOSIS — R1010 Upper abdominal pain, unspecified: Secondary | ICD-10-CM | POA: Diagnosis present

## 2022-11-21 DIAGNOSIS — D179 Benign lipomatous neoplasm, unspecified: Secondary | ICD-10-CM | POA: Diagnosis present

## 2022-11-21 DIAGNOSIS — R1110 Vomiting, unspecified: Secondary | ICD-10-CM | POA: Insufficient documentation

## 2022-11-21 DIAGNOSIS — E860 Dehydration: Secondary | ICD-10-CM | POA: Diagnosis present

## 2022-11-21 DIAGNOSIS — Z7151 Drug abuse counseling and surveillance of drug abuser: Secondary | ICD-10-CM

## 2022-11-21 DIAGNOSIS — R1013 Epigastric pain: Secondary | ICD-10-CM | POA: Diagnosis present

## 2022-11-21 DIAGNOSIS — M62838 Other muscle spasm: Secondary | ICD-10-CM | POA: Diagnosis not present

## 2022-11-21 LAB — CBC
HCT: 53.1 % — ABNORMAL HIGH (ref 39.0–52.0)
HCT: 50.7 % (ref 39.0–52.0)
Hemoglobin: 18.5 g/dL — ABNORMAL HIGH (ref 13.0–17.0)
Hemoglobin: 18.5 g/dL — ABNORMAL HIGH (ref 13.0–17.0)
MCH: 30.9 pg (ref 26.0–34.0)
MCH: 32 pg (ref 26.0–34.0)
MCHC: 34.8 g/dL (ref 30.0–36.0)
MCHC: 36.5 g/dL — ABNORMAL HIGH (ref 30.0–36.0)
MCV: 88.8 fL (ref 80.0–100.0)
MCV: 87.7 fL (ref 80.0–100.0)
Platelets: 293 10*3/uL (ref 150–400)
Platelets: 273 10*3/uL (ref 150–400)
RBC: 5.98 MIL/uL — ABNORMAL HIGH (ref 4.22–5.81)
RBC: 5.78 MIL/uL (ref 4.22–5.81)
RDW: 13.2 % (ref 11.5–15.5)
RDW: 13.2 % (ref 11.5–15.5)
WBC: 16.9 10*3/uL — ABNORMAL HIGH (ref 4.0–10.5)
WBC: 17 10*3/uL — ABNORMAL HIGH (ref 4.0–10.5)
nRBC: 0 % (ref 0.0–0.2)
nRBC: 0 % (ref 0.0–0.2)

## 2022-11-21 LAB — CBG MONITORING, ED: Glucose-Capillary: 135 mg/dL — ABNORMAL HIGH (ref 70–99)

## 2022-11-21 LAB — BLOOD GAS, VENOUS
Acid-Base Excess: 12.8 mmol/L — ABNORMAL HIGH (ref 0.0–2.0)
Bicarbonate: 35.5 mmol/L — ABNORMAL HIGH (ref 20.0–28.0)
Drawn by: 4653
O2 Saturation: 70.7 %
Patient temperature: 37
pCO2, Ven: 37 mmHg — ABNORMAL LOW (ref 44–60)
pH, Ven: 7.59 — ABNORMAL HIGH (ref 7.25–7.43)
pO2, Ven: 40 mmHg (ref 32–45)

## 2022-11-21 LAB — URINALYSIS, ROUTINE W REFLEX MICROSCOPIC
Bacteria, UA: NONE SEEN
Bilirubin Urine: NEGATIVE
Glucose, UA: NEGATIVE mg/dL
Glucose, UA: 50 mg/dL — AB
Hgb urine dipstick: NEGATIVE
Ketones, ur: 5 mg/dL — AB
Ketones, ur: NEGATIVE mg/dL
Leukocytes,Ua: NEGATIVE
Nitrite: NEGATIVE
Nitrite: NEGATIVE
Protein, ur: >=300 mg/dL — AB
Protein, ur: 100 mg/dL — AB
Specific Gravity, Urine: 1.033 — ABNORMAL HIGH (ref 1.005–1.030)
Specific Gravity, Urine: 1.028 (ref 1.005–1.030)
pH: 5 (ref 5.0–8.0)
pH: 5 (ref 5.0–8.0)

## 2022-11-21 LAB — COMPREHENSIVE METABOLIC PANEL
ALT: 22 U/L (ref 0–44)
ALT: 26 U/L (ref 0–44)
AST: 47 U/L — ABNORMAL HIGH (ref 15–41)
AST: 48 U/L — ABNORMAL HIGH (ref 15–41)
Albumin: 5.4 g/dL — ABNORMAL HIGH (ref 3.5–5.0)
Albumin: 4.9 g/dL (ref 3.5–5.0)
Alkaline Phosphatase: 104 U/L (ref 38–126)
Alkaline Phosphatase: 99 U/L (ref 38–126)
Anion gap: 20 — ABNORMAL HIGH (ref 5–15)
Anion gap: 20 — ABNORMAL HIGH (ref 5–15)
BUN: 39 mg/dL — ABNORMAL HIGH (ref 6–20)
BUN: 37 mg/dL — ABNORMAL HIGH (ref 6–20)
CO2: 19 mmol/L — ABNORMAL LOW (ref 22–32)
CO2: 18 mmol/L — ABNORMAL LOW (ref 22–32)
Calcium: 10.5 mg/dL — ABNORMAL HIGH (ref 8.9–10.3)
Calcium: 10.2 mg/dL (ref 8.9–10.3)
Chloride: 93 mmol/L — ABNORMAL LOW (ref 98–111)
Chloride: 93 mmol/L — ABNORMAL LOW (ref 98–111)
Creatinine, Ser: 2.01 mg/dL — ABNORMAL HIGH (ref 0.61–1.24)
Creatinine, Ser: 2.43 mg/dL — ABNORMAL HIGH (ref 0.61–1.24)
GFR, Estimated: 39 mL/min — ABNORMAL LOW (ref >60)
GFR, Estimated: 31 mL/min — ABNORMAL LOW (ref >60)
Glucose, Bld: 285 mg/dL — ABNORMAL HIGH (ref 70–99)
Glucose, Bld: 273 mg/dL — ABNORMAL HIGH (ref 70–99)
Potassium: 3.6 mmol/L (ref 3.5–5.1)
Potassium: 3.5 mmol/L (ref 3.5–5.1)
Sodium: 132 mmol/L — ABNORMAL LOW (ref 135–145)
Sodium: 131 mmol/L — ABNORMAL LOW (ref 135–145)
Total Bilirubin: 1.9 mg/dL — ABNORMAL HIGH (ref 0.3–1.2)
Total Bilirubin: 0.9 mg/dL (ref 0.3–1.2)
Total Protein: 9.6 g/dL — ABNORMAL HIGH (ref 6.5–8.1)
Total Protein: 8.4 g/dL — ABNORMAL HIGH (ref 6.5–8.1)

## 2022-11-21 LAB — RAPID URINE DRUG SCREEN, HOSP PERFORMED
Amphetamines: NOT DETECTED
Barbiturates: NOT DETECTED
Benzodiazepines: NOT DETECTED
Cocaine: NOT DETECTED
Opiates: NOT DETECTED
Tetrahydrocannabinol: POSITIVE — AB

## 2022-11-21 LAB — I-STAT VENOUS BLOOD GAS, ED
Acid-Base Excess: 5 mmol/L — ABNORMAL HIGH (ref 0.0–2.0)
Bicarbonate: 28.8 mmol/L — ABNORMAL HIGH (ref 20.0–28.0)
Calcium, Ion: 1.06 mmol/L — ABNORMAL LOW (ref 1.15–1.40)
HCT: 53 % — ABNORMAL HIGH (ref 39.0–52.0)
Hemoglobin: 18 g/dL — ABNORMAL HIGH (ref 13.0–17.0)
O2 Saturation: 65 %
Potassium: 3.6 mmol/L (ref 3.5–5.1)
Sodium: 134 mmol/L — ABNORMAL LOW (ref 135–145)
TCO2: 30 mmol/L (ref 22–32)
pCO2, Ven: 37.4 mmHg — ABNORMAL LOW (ref 44–60)
pH, Ven: 7.495 — ABNORMAL HIGH (ref 7.25–7.43)
pO2, Ven: 31 mmHg — CL (ref 32–45)

## 2022-11-21 LAB — BETA-HYDROXYBUTYRIC ACID: Beta-Hydroxybutyric Acid: 0.25 mmol/L (ref 0.05–0.27)

## 2022-11-21 LAB — HIV ANTIBODY (ROUTINE TESTING W REFLEX): HIV Screen 4th Generation wRfx: NONREACTIVE

## 2022-11-21 LAB — TROPONIN I (HIGH SENSITIVITY)
Troponin I (High Sensitivity): 25 ng/L — ABNORMAL HIGH (ref <18)
Troponin I (High Sensitivity): 26 ng/L — ABNORMAL HIGH (ref <18)

## 2022-11-21 LAB — RESP PANEL BY RT-PCR (RSV, FLU A&B, COVID)  RVPGX2
Influenza A by PCR: NEGATIVE
Influenza B by PCR: NEGATIVE
Resp Syncytial Virus by PCR: NEGATIVE
SARS Coronavirus 2 by RT PCR: NEGATIVE

## 2022-11-21 LAB — LIPASE, BLOOD
Lipase: 34 U/L (ref 11–51)
Lipase: 31 U/L (ref 11–51)

## 2022-11-21 LAB — ETHANOL: Alcohol, Ethyl (B): <10 mg/dL (ref <10)

## 2022-11-21 MED ORDER — LACTATED RINGERS IV BOLUS
1000.0000 mL | Freq: Once | INTRAVENOUS | Status: AC
  Administered 2022-11-21: 1000 mL via INTRAVENOUS

## 2022-11-21 MED ORDER — LACTATED RINGERS IV BOLUS: 1000.0000 mL | Freq: Once | INTRAVENOUS | Status: DC

## 2022-11-21 MED ORDER — LACTATED RINGERS IV SOLN: INTRAVENOUS | Status: AC

## 2022-11-21 MED ORDER — ACETAMINOPHEN 325 MG PO TABS
650.0000 mg | ORAL_TABLET | Freq: Four times a day (QID) | ORAL | Status: DC | PRN
  Administered 2022-11-21: 650 mg via ORAL
  Filled 2022-11-21: qty 2

## 2022-11-21 MED ORDER — INSULIN ASPART 100 UNIT/ML IJ SOLN
6.0000 [IU] | Freq: Once | INTRAMUSCULAR | Status: AC
  Administered 2022-11-21: 6 [IU] via SUBCUTANEOUS

## 2022-11-21 MED ORDER — ONDANSETRON HCL 4 MG/2ML IJ SOLN
4.0000 mg | Freq: Once | INTRAMUSCULAR | Status: AC
  Administered 2022-11-21: 4 mg via INTRAVENOUS
  Filled 2022-11-21: qty 2

## 2022-11-21 MED ORDER — LACTATED RINGERS IV BOLUS
2000.0000 mL | Freq: Once | INTRAVENOUS | Status: AC
  Administered 2022-11-21: 2000 mL via INTRAVENOUS

## 2022-11-21 MED ORDER — MORPHINE SULFATE (PF) 4 MG/ML IV SOLN
4.0000 mg | Freq: Once | INTRAVENOUS | Status: AC
  Administered 2022-11-21: 4 mg via INTRAVENOUS
  Filled 2022-11-21: qty 1

## 2022-11-21 MED ORDER — ONDANSETRON 4 MG PO TBDP
8.0000 mg | ORAL_TABLET | Freq: Three times a day (TID) | ORAL | Status: DC | PRN
  Administered 2022-11-21 – 2022-11-23 (×2): 8 mg via ORAL
  Filled 2022-11-21 (×2): qty 2

## 2022-11-21 MED ORDER — LACTATED RINGERS IV SOLN: INTRAVENOUS | Status: DC

## 2022-11-21 MED ORDER — ONDANSETRON 4 MG PO TBDP
4.0000 mg | ORAL_TABLET | Freq: Once | ORAL | Status: AC | PRN
  Administered 2022-11-21: 4 mg via ORAL
  Filled 2022-11-21: qty 1

## 2022-11-21 MED ORDER — RIVAROXABAN 10 MG PO TABS
10.0000 mg | ORAL_TABLET | Freq: Every day | ORAL | Status: DC
  Administered 2022-11-21 – 2022-11-24 (×4): 10 mg via ORAL
  Filled 2022-11-21 (×4): qty 1

## 2022-11-21 NOTE — Hospital Course (Signed)
Cramps for 3 days Hasn't peed or pooped in 3 days Could not keep fluids down, including water He has not been peeing a lot He was able to keep a frosty down 50 cramps in a day At bacon egg and cheese biscuit Speedy gonzales Rare burger?   Previously sick to stomach due to e coli   No fevers, no chills  PMH Lipoma cutting off on 1/27   Medications No medications  Fhx M died from heart attack at 43s Dad had DM Grandfather died of lung cancer  Social GF lives with him. No PCP. 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 Quit smoking in July and started back then quit again 3 days ago. 1.5 Packs per day. Rarely drinks alcohol. Smokes pot, 1-2 joints daily since 13. Hasn't in 3-4 days.  Oliguric AKI    Tobacco use disorder Wants to stop but not interested in meds

## 2022-11-21 NOTE — ED Provider Notes (Signed)
[facility] EMERGENCY DEPARTMENT Provider Note   CSN: [id number] Arrival date & time: 11/21/22  2778     History  Chief Complaint  Patient presents with   Abdominal Pain   Chest Pain    Tyler Bennett is a 52 y.o. male.   Abdominal Pain Associated symptoms: chest pain   Chest Pain Associated symptoms: abdominal pain      Patient has a history of trouble with recurrent abdominal pain and vomiting.  Patient states he has been seen for it a few times over the last several years.  Patient states one-time roommate was told it was related to marijuana use but he does not think that is what is going on.  Another time he was told it was related to hypokalemia.  Patient states he started having symptoms a few days ago.  He has had 3 days of multiple episodes of vomiting.  He has not been able to keep anything down.  He is now having diffuse abdominal pain and cramping.  Is also having diffuse cramping throughout his body and and even up into his chest.  He denies any diarrhea.  He denies any dysuria.  No known fevers.  Home Medications Prior to Admission medications   Medication Sig Start Date End Date Taking? Authorizing Provider  doxycycline (VIBRAMYCIN) 100 MG capsule Take 1 capsule (100 mg total) by mouth 2 (two) times daily. 06/20/22   Francene Finders, PA-C  pantoprazole sodium (PROTONIX) 40 mg Take 40 mg by mouth daily for 14 days. 06/02/22 06/16/22  Lucrezia Starch, MD  promethazine (PHENERGAN) 25 MG suppository Place 1 suppository (25 mg total) rectally every 6 (six) hours as needed for nausea or vomiting. 05/26/15   Annita Brod, MD  sucralfate (CARAFATE) 1 GM/10ML suspension Take 10 mLs (1 g total) by mouth 4 (four) times daily for 3 days. 06/02/22 06/05/22  Lucrezia Starch, MD  zolpidem (AMBIEN) 5 MG tablet Take 1 tablet (5 mg total) by mouth at bedtime as needed for sleep. 05/28/15   Charlesetta Shanks, MD      Allergies    Patient has no known allergies.     Review of Systems   Review of Systems  Cardiovascular:  Positive for chest pain.  Gastrointestinal:  Positive for abdominal pain.    Physical Exam Updated Vital Signs BP 124/79   Pulse 79   Temp 98.2 F (36.8 C) (Oral)   Resp 11   SpO2 96%  Physical Exam Vitals and nursing note reviewed.  Constitutional:      Appearance: He is well-developed. He is ill-appearing.  HENT:     Head: Normocephalic and atraumatic.     Comments: Mucous membranes dry    Right Ear: External ear normal.     Left Ear: External ear normal.     Mouth/Throat:     Pharynx: No oropharyngeal exudate.  Eyes:     General: No scleral icterus.       Right eye: No discharge.        Left eye: No discharge.     Conjunctiva/sclera: Conjunctivae normal.  Neck:     Trachea: No tracheal deviation.  Cardiovascular:     Rate and Rhythm: Normal rate and regular rhythm.  Pulmonary:     Effort: Pulmonary effort is normal. No respiratory distress.     Breath sounds: Normal breath sounds. No stridor. No wheezing or rales.  Abdominal:     General: Bowel sounds are normal. There is  no distension.     Palpations: Abdomen is soft.     Tenderness: There is generalized abdominal tenderness. There is no guarding or rebound.     Hernia: No hernia is present.  Musculoskeletal:        General: No tenderness or deformity.     Cervical back: Neck supple.  Skin:    General: Skin is warm and dry.     Coloration: Skin is not mottled or pale.     Findings: No erythema or rash.  Neurological:     General: No focal deficit present.     Mental Status: He is alert.     Cranial Nerves: No cranial nerve deficit, dysarthria or facial asymmetry.     Sensory: No sensory deficit.     Motor: No abnormal muscle tone or seizure activity.     Coordination: Coordination normal.  Psychiatric:        Mood and Affect: Mood normal.     ED Results / Procedures / Treatments   Labs (all labs ordered are listed, but only abnormal results  are displayed) Labs Reviewed  COMPREHENSIVE METABOLIC PANEL - Abnormal; Notable for the following components:      Result Value   Sodium 131 (*)    Chloride 93 (*)    CO2 18 (*)    Glucose, Bld 273 (*)    BUN 37 (*)    Creatinine, Ser 2.43 (*)    Total Protein 8.4 (*)    AST 48 (*)    GFR, Estimated 31 (*)    Anion gap 20 (*)    All other components within normal limits  CBC - Abnormal; Notable for the following components:   WBC 17.0 (*)    Hemoglobin 18.5 (*)    MCHC 36.5 (*)    All other components within normal limits  URINALYSIS, ROUTINE W REFLEX MICROSCOPIC - Abnormal; Notable for the following components:   Color, Urine AMBER (*)    APPearance CLOUDY (*)    Glucose, UA 50 (*)    Protein, ur 100 (*)    All other components within normal limits  RAPID URINE DRUG SCREEN, HOSP PERFORMED - Abnormal; Notable for the following components:   Tetrahydrocannabinol POSITIVE (*)    All other components within normal limits  CBG MONITORING, ED - Abnormal; Notable for the following components:   Glucose-Capillary 135 (*)    All other components within normal limits  TROPONIN I (HIGH SENSITIVITY) - Abnormal; Notable for the following components:   Troponin I (High Sensitivity) 25 (*)    All other components within normal limits  TROPONIN I (HIGH SENSITIVITY) - Abnormal; Notable for the following components:   Troponin I (High Sensitivity) 26 (*)    All other components within normal limits  RESP PANEL BY RT-PCR (RSV, FLU A&B, COVID)  RVPGX2  LIPASE, BLOOD  ETHANOL  BETA-HYDROXYBUTYRIC ACID  BLOOD GAS, VENOUS    EKG Rate 93, normal sinus rhythm with occasional PVCs, right atrial enlargement, nonspecific T wave changes, no significant changes compared to prior EKG.  Formal reading in muse  Radiology CT Renal Stone Study  Result Date: 11/21/2022 CLINICAL DATA:  Upper abdominal pain and vomiting. EXAM: CT ABDOMEN AND PELVIS WITHOUT CONTRAST TECHNIQUE: Multidetector CT imaging of  the abdomen and pelvis was performed following the standard protocol without IV contrast. RADIATION DOSE REDUCTION: This exam was performed according to the departmental dose-optimization program which includes automated exposure control, adjustment of the mA and/or kV according to patient  size and/or use of iterative reconstruction technique. COMPARISON:  05/25/2022 FINDINGS: Lower chest: Unremarkable. Hepatobiliary: No suspicious focal abnormality in the liver on this study without intravenous contrast. There is no evidence for gallstones, gallbladder wall thickening, or pericholecystic fluid. No intrahepatic or extrahepatic biliary dilation. Pancreas: No focal mass lesion. No dilatation of the main duct. No intraparenchymal cyst. No peripancreatic edema. Spleen: No splenomegaly. No focal mass lesion. Adrenals/Urinary Tract: No adrenal nodule or mass. Kidneys unremarkable. No evidence for hydroureter. The urinary bladder appears normal for the degree of distention. Stomach/Bowel: Stomach is unremarkable. No gastric wall thickening. No evidence of outlet obstruction. Duodenum is normally positioned as is the ligament of Treitz. No small bowel wall thickening. No small bowel dilatation. The appendix is normal. The terminal ileum is normal. No gross colonic mass. No colonic wall thickening. Vascular/Lymphatic: There is mild atherosclerotic calcification of the abdominal aorta without aneurysm. There is no gastrohepatic or hepatoduodenal ligament lymphadenopathy. No retroperitoneal or mesenteric lymphadenopathy. No pelvic sidewall lymphadenopathy. Reproductive: The prostate gland and seminal vesicles are unremarkable. Other: No intraperitoneal free fluid. Musculoskeletal: No worrisome lytic or sclerotic osseous abnormality. IMPRESSION: 1. No acute findings in the abdomen or pelvis. Specifically, no findings to explain the patient's history of upper abdominal pain and vomiting. 2.  Aortic Atherosclerosis (ICD10-I70.0).  Electronically Signed   By: Misty Eckstein M.D.   On: 11/21/2022 12:29   DG Chest 2 View  Result Date: 11/21/2022 CLINICAL DATA:  Chest pain EXAM: CHEST - 2 VIEW COMPARISON:  05/26/2022 FINDINGS: Normal heart size and mediastinal contours. No acute infiltrate or edema. No effusion or pneumothorax. No acute osseous findings. IMPRESSION: Negative chest. Electronically Signed   By: Jorje Guild M.D.   On: 11/21/2022 09:08    Procedures .Critical Care  Performed by: Dorie Rank, MD Authorized by: Dorie Rank, MD   Critical care provider statement:    Critical care time (minutes):  30   Critical care was time spent personally by me on the following activities:  Development of treatment plan with patient or surrogate, discussions with consultants, evaluation of patient's response to treatment, examination of patient, ordering and review of laboratory studies, ordering and review of radiographic studies, ordering and performing treatments and interventions, pulse oximetry, re-evaluation of patient's condition and review of old charts     Medications Ordered in ED Medications  insulin aspart (novoLOG) injection 6 Units (has no administration in time range)  lactated ringers bolus 1,000 mL (has no administration in time range)  lactated ringers bolus 2,000 mL (2,000 mLs Intravenous New Bag/Given 11/21/22 1024)  ondansetron (ZOFRAN) injection 4 mg (4 mg Intravenous Given 11/21/22 1024)  morphine (PF) 4 MG/ML injection 4 mg (4 mg Intravenous Given 11/21/22 1024)    ED Course/ Medical Decision Making/ A&P Clinical Course as of 11/21/22 1314  Sat Nov 21, 2022  1005 Chest x-ray without acute findings [JK]  1005 Urinalysis, Routine w reflex microscopic Urine, Clean Catch(!) Hematuria noted [JK]  1006 CBC(!) Leukocytosis noted [JK]  1133 Troponin I (High Sensitivity)(!) Troponin slightly elevated, no significant delta troponin [JK]  1134 Comprehensive metabolic panel(!) Hyperglycemia and acute kidney  injury noted, elevated anion gap [JK]  1134 Urinalysis, Routine w reflex microscopic Urine, Clean Catch(!) Urinalysis does show hematuria [JK]  1134 Resp panel by RT-PCR (RSV, Flu A&B, Covid) Anterior Nasal Swab Covid flu negative [JK]  1135 Anion gap metabolic acidosis noted.  Patient has hyperglycemia.  DKA is a consideration.  It is also possible his elevated anion gap  is related to his uremia.  He does not have a history of diabetes.  Will give subcu insulin at this time.  I have ordered additional beta hydroxybutyrate and venous blood gas. [JK]  1251 CT scan without acute findings [JK]  1313 Case Discussed with Dr. Howie Ill regarding admission [JK]    Clinical Course User Index [JK] Dorie Rank, MD                           Medical Decision Making Parental diagnosis includes but not limited to hepatitis pancreatitis bowel obstruction gastroenteritis, dehydration  Problems Addressed: AKI (acute kidney injury) [facility]): acute illness or injury that poses a threat to life or bodily functions Hyperglycemia: acute illness or injury Metabolic acidosis: acute illness or injury that poses a threat to life or bodily functions Nausea and vomiting, unspecified vomiting type: acute illness or injury that poses a threat to life or bodily functions  Amount and/or Complexity of Data Reviewed Labs: ordered. Decision-making details documented in ED Course. Radiology: ordered.  Risk Prescription drug management. Decision regarding hospitalization.   Patient presented with several days of abdominal pain nausea vomiting.  Labs notable for an acute kidney injury with elevation in his BUN and creatinine.  Patient also has an anion gap metabolic acidosis.  Hyperglycemia noted, no history of diabetes.  DKA is a consideration although it is also possible his anion gap is related to his dehydration and acute renal failure.  Patient has been started on IV fluids.  I have given him a dose of insulin.  Will plan  on rechecking metabolic panel, blood gas as well as beta hydroxybutyric acid.  Troponin is also slightly elevated but it is flat and I do not see ischemic changes on the EKG.  May be related to his acute renal changes.  CT scan was performed there is no acute abnormalities noted on his scan.  No evidence of bladder outlet obstruction.  I will consult the medical service for admission and further treatment.        Final Clinical Impression(s) / ED Diagnoses Final diagnoses:  Nausea and vomiting, unspecified vomiting type  Metabolic acidosis  AKI (acute kidney injury) ([employer])  Hyperglycemia    Rx / DC Orders ED Discharge Orders     None         Dorie Rank, MD 11/21/22 1314

## 2022-11-21 NOTE — ED Notes (Signed)
Spoke with patient about complaint and limitations; pt became upset and walked out of [facility]

## 2022-11-21 NOTE — ED Triage Notes (Signed)
Pt presents to ED via POV c/o upper abd pain and vomiting. Pt states this started 3 days ago and has not been able to keep anything down since. Pt denies any diarrhea, fevers, CP, SOB

## 2022-11-21 NOTE — ED Notes (Signed)
Post void residual is 0, bladder scanner completed by Nila, NT.

## 2022-11-21 NOTE — ED Notes (Signed)
Pt to the stat desk asking how much longer.  Pt was advised there were approx 4 people ahead of him.  He states he will wait another hour and then go to [facility].  Pt went and got his coat and stated "Im going to [facility] now.  Ambulated out the door

## 2022-11-21 NOTE — ED Triage Notes (Signed)
Pt here with reports of diffuse abdominal pain along with emesis X 3 days. Pt now complaining of chest pain and cramping all over his body.

## 2022-11-21 NOTE — H&P (Addendum)
Date: 11/21/2022               Patient Name:  Tyler Bennett MRN: [id number]  DOB: Jul 01, 1971 Age / Sex: 52 y.o., male   PCP: Pcp, No         Medical Service: Internal Medicine Teaching Service         Attending Physician: Dr. Velna Ochs, MD      First Contact: Dr. Drucie Opitz, MD Pager [phone]    Second Contact: Dr. Christiana Fuchs, DO Pager [phone]         After Hours (After 5p/  First Contact Pager: [phone]  weekends / holidays): Second Contact Pager: [phone]   SUBJECTIVE   Chief Complaint: Abdominal pain, nausea, vomiting  History of Present Illness:   Mr. Tyler Bennett is a 52 year old male with no significant past medical history who presents with a three day history of nausea, vomiting, and abdominal pain. For the last three days, he has had significant cramping in his epigastric area as well as in his legs, arms, and chest today. He has attempted to eat and drink since symptoms started, however every time he does he vomits, including water. Before symptom onset, he ate a burger that "looked pretty red", and after eating he had his first episode of vomiting, and stomach cramps preceded after. He has not urinated or had a bowel movement since onset of symptoms. This morning, he was able to hold down two frostys and was the first PO intake he has had in three days. He describes the abdominal pain as a "hole in his abdomen", and is a dull aching pain that does not radiate. He states when he does have abdominal pain, it can get up to a 10/10. No alleviating or precipitating factors. Of note, he presented to multiple ED's and urgent cares in July with emesis episodes, and  was positive for E. Coli. Per ED notes, hyperemesis secondary to [facility] was also on the differential.    He denies any fevers, chills, current chest pain, current vomiting, or nausea.   ED Course: CTA showed no acute intra-abdominal process, labs revealed anion gap metabolic acidosis and initial concern was for  DKA. Was given IV fluids and and a dose of insulin. IMTS was consulted for admission.    Meds:  Tylenol   Past Medical History Lipoma excision to be done on 1/27   Past Surgical History:  Procedure Laterality Date   CARDIAC CATHETERIZATION N/A 05/24/2015   Procedure: Left Heart Cath and Coronary Angiography;  Surgeon: Sherren Mocha, MD;  Location: [facility];  Service: Cardiovascular;  Laterality: N/A;    Social:  Lives With: With his girlfriend  Occupation: Clinical biochemist for 30 years Support: Girlfriend and two sisters in the area Level of Function: Able to perform all ADLs/IADLs when not acutely ill PCP: No PCP Substances: 1.5 packs a day for 35 years. Also had smoked 1-2 marijuana joints daily for 30 years. Has quit cigarrete use   Family History:  Mother died from heart attack at 26s Dad had DM Grandfather died of lung cancer  Allergies: No known Drug Allergies   Review of Systems: A complete ROS was negative except as per HPI.   OBJECTIVE:   Physical Exam: Blood pressure 124/79, pulse 79, temperature 98.2 F (36.8 C), temperature source Oral, resp. rate 11, SpO2 96 %.  Constitutional: well-appearing male, laying in bed, in no acute distress HENT: normocephalic atraumatic, mucous membranes dry, protuberated, red small pimple above  left eyebrow Eyes: conjunctiva non-erythematous Neck: supple Cardiovascular: regular rate and rhythm, no m/r/g Pulmonary/Chest: normal work of breathing on room air, lungs clear to auscultation bilaterally Abdominal: soft, mild tenderness in the RUQ, non-distended, Murphys sign negative, no CVA tenderness MSK: normal bulk and tone Neurological: alert & oriented x 3, 5/5 strength in bilateral upper and lower extremities, normal gait Skin: warm and dry, decreased skin turgor, lipoma present on right upper extremity Psych: normal mood and affect  Labs: CBC    Component Value Date/Time   WBC 17.0 (H) 11/21/2022 0849   RBC 5.78  11/21/2022 0849   HGB 18.0 (H) 11/21/2022 1358   HGB 13.6 05/10/2014 0359   HCT 53.0 (H) 11/21/2022 1358   HCT 39.8 (L) 05/10/2014 0359   PLT 273 11/21/2022 0849   PLT 210 05/10/2014 0359   MCV 87.7 11/21/2022 0849   MCV 94 05/10/2014 0359   MCH 32.0 11/21/2022 0849   MCHC 36.5 (H) 11/21/2022 0849   RDW 13.2 11/21/2022 0849   RDW 13.7 05/10/2014 0359   LYMPHSABS 2.3 06/22/2022 1218   LYMPHSABS 2.5 05/10/2014 0359   MONOABS 0.8 06/22/2022 1218   MONOABS 1.0 05/10/2014 0359   EOSABS 0.2 06/22/2022 1218   EOSABS 0.2 05/10/2014 0359   BASOSABS 0.1 06/22/2022 1218   BASOSABS 0.0 05/10/2014 0359     CMP     Component Value Date/Time   NA 134 (L) 11/21/2022 1358   NA 140 05/10/2014 0359   K 3.6 11/21/2022 1358   K 3.6 05/10/2014 0359   CL 93 (L) 11/21/2022 0849   CL 110 (H) 05/10/2014 0359   CO2 18 (L) 11/21/2022 0849   CO2 24 05/10/2014 0359   GLUCOSE 273 (H) 11/21/2022 0849   GLUCOSE 83 05/10/2014 0359   BUN 37 (H) 11/21/2022 0849   BUN 8 05/10/2014 0359   CREATININE 2.43 (H) 11/21/2022 0849   CREATININE 1.07 05/10/2014 0359   CALCIUM 10.2 11/21/2022 0849   CALCIUM 8.1 (L) 05/10/2014 0359   PROT 8.4 (H) 11/21/2022 0849   PROT 6.0 (L) 05/10/2014 0359   ALBUMIN 4.9 11/21/2022 0849   ALBUMIN 2.9 (L) 05/10/2014 0359   AST 48 (H) 11/21/2022 0849   AST 16 05/10/2014 0359   ALT 26 11/21/2022 0849   ALT 11 (L) 05/10/2014 0359   ALKPHOS 99 11/21/2022 0849   ALKPHOS 71 05/10/2014 0359   BILITOT 0.9 11/21/2022 0849   BILITOT 0.4 05/10/2014 0359   GFRNONAA 31 (L) 11/21/2022 0849   GFRNONAA >60 05/10/2014 0359   GFRAA >60 05/28/2015 1041   GFRAA >60 05/10/2014 0359    Imaging:  CT Renal Stone Study  Result Date: 11/21/2022 CLINICAL DATA:  Upper abdominal pain and vomiting. EXAM: CT ABDOMEN AND PELVIS WITHOUT CONTRAST TECHNIQUE: Multidetector CT imaging of the abdomen and pelvis was performed following the standard protocol without IV contrast. RADIATION DOSE REDUCTION:  This exam was performed according to the departmental dose-optimization program which includes automated exposure control, adjustment of the mA and/or kV according to patient size and/or use of iterative reconstruction technique. COMPARISON:  05/25/2022 FINDINGS: Lower chest: Unremarkable. Hepatobiliary: No suspicious focal abnormality in the liver on this study without intravenous contrast. There is no evidence for gallstones, gallbladder wall thickening, or pericholecystic fluid. No intrahepatic or extrahepatic biliary dilation. Pancreas: No focal mass lesion. No dilatation of the main duct. No intraparenchymal cyst. No peripancreatic edema. Spleen: No splenomegaly. No focal mass lesion. Adrenals/Urinary Tract: No adrenal nodule or mass. Kidneys unremarkable. No evidence  for hydroureter. The urinary bladder appears normal for the degree of distention. Stomach/Bowel: Stomach is unremarkable. No gastric wall thickening. No evidence of outlet obstruction. Duodenum is normally positioned as is the ligament of Treitz. No small bowel wall thickening. No small bowel dilatation. The appendix is normal. The terminal ileum is normal. No gross colonic mass. No colonic wall thickening. Vascular/Lymphatic: There is mild atherosclerotic calcification of the abdominal aorta without aneurysm. There is no gastrohepatic or hepatoduodenal ligament lymphadenopathy. No retroperitoneal or mesenteric lymphadenopathy. No pelvic sidewall lymphadenopathy. Reproductive: The prostate gland and seminal vesicles are unremarkable. Other: No intraperitoneal free fluid. Musculoskeletal: No worrisome lytic or sclerotic osseous abnormality. IMPRESSION: 1. No acute findings in the abdomen or pelvis. Specifically, no findings to explain the patient's history of upper abdominal pain and vomiting. 2.  Aortic Atherosclerosis (ICD10-I70.0). Electronically Signed   By: Misty Cavanaugh M.D.   On: 11/21/2022 12:29   DG Chest 2 View  Result Date:  11/21/2022 CLINICAL DATA:  Chest pain EXAM: CHEST - 2 VIEW COMPARISON:  05/26/2022 FINDINGS: Normal heart size and mediastinal contours. No acute infiltrate or edema. No effusion or pneumothorax. No acute osseous findings. IMPRESSION: Negative chest. Electronically Signed   By: Jorje Guild M.D.   On: 11/21/2022 09:08      EKG: personally reviewed my interpretation is sinus rate rhythm, occasional PVCs, no axis deviation, similar to previous EKG  ASSESSMENT & PLAN:   Assessment & Plan by Problem: Principal Problem:   AKI (acute kidney injury) ([facility])   ERROLL WILBOURNE is a 52 y.o. person living with no relevant prior medical history who presented with multiple days of abdominal pain, nausea, vomiting and admitted for AKI on hospital day 0  #Oliguric AKI secondary to hypovolemia in the setting of three day history of vomiting Patient presents with a three day history of vomiting and decreased PO intake after eating a burger from a new restaurant. Pt has been positive for EPEC in July, however was not given antibiotic treatment for it. CTA/P obtained in the emergency department showed no acute intra-abdominal findings, including pancreas, gallbladder, and no bladder distension. Lipase, AST, ALT all within normal limits. Metabolic acidosis likely secondary to renal injury and dehydration.  Creatinine currently at 2.43, baseline appears to be around 1. Will treat patient with fluids for volume repletion. Drug screen positive for THC, however pt states he has not smoked for four days now, unlikely cause of his current symptoms. Low suspicion for DKA at this time given no history of diabetes and near normal levels of glucose. H. Pylori testing done in July within normal limits. He has currently received 2 Bolus's of LR, will give him another one. Currently suspicious for gastroenteritis. Post-void residual volume 0.   Plan:  - LR Bolus - Can consider Zofran for nausea  - Can add pain regimen if pain  returns.  - Daily BMP - Can consider renal ultrasound if worsening of renal function - Avoid nephrotoxic agents     #Tobacco Use Disorder Pt has been attempting to quit smoking cigarettes, and is determined to quit. He states he does not need any help such as medications, and is able to do so on his own. Will follow up in the outpatient setting as he is open to establishing care at the internal medicine center.    Diet: Liquid Diet VTE: SCDs IVF: LR,Bolus Code: Full  Prior to Admission Living Arrangement: Home Anticipated Discharge Location: Home Barriers to Discharge: Medical Management  Dispo: Admit  patient to Observation with expected length of stay less than 2 midnights.  Signed: Drucie Opitz, MD Internal Medicine Resident PGY-1  11/21/2022, 2:22 PM   Dr. Drucie Opitz, MD Pager [phone]

## 2022-11-21 NOTE — ED Notes (Signed)
Got patient on the monitor patient is resting with call bell in reach  ?

## 2022-11-22 DIAGNOSIS — R739 Hyperglycemia, unspecified: Secondary | ICD-10-CM | POA: Diagnosis present

## 2022-11-22 LAB — CBC
HCT: 48.4 % (ref 39.0–52.0)
Hemoglobin: 16.8 g/dL (ref 13.0–17.0)
MCH: 31.2 pg (ref 26.0–34.0)
MCHC: 34.7 g/dL (ref 30.0–36.0)
MCV: 90 fL (ref 80.0–100.0)
Platelets: 213 10*3/uL (ref 150–400)
RBC: 5.38 MIL/uL (ref 4.22–5.81)
RDW: 13.2 % (ref 11.5–15.5)
WBC: 10.6 10*3/uL — ABNORMAL HIGH (ref 4.0–10.5)
nRBC: 0 % (ref 0.0–0.2)

## 2022-11-22 LAB — BASIC METABOLIC PANEL
Anion gap: 13 (ref 5–15)
BUN: 26 mg/dL — ABNORMAL HIGH (ref 6–20)
CO2: 25 mmol/L (ref 22–32)
Calcium: 9.1 mg/dL (ref 8.9–10.3)
Chloride: 95 mmol/L — ABNORMAL LOW (ref 98–111)
Creatinine, Ser: 1.2 mg/dL (ref 0.61–1.24)
GFR, Estimated: >60 mL/min (ref >60)
Glucose, Bld: 117 mg/dL — ABNORMAL HIGH (ref 70–99)
Potassium: 3.7 mmol/L (ref 3.5–5.1)
Sodium: 133 mmol/L — ABNORMAL LOW (ref 135–145)

## 2022-11-22 LAB — BLOOD GAS, VENOUS
Acid-Base Excess: 13.7 mmol/L — ABNORMAL HIGH (ref 0.0–2.0)
Bicarbonate: 37.6 mmol/L — ABNORMAL HIGH (ref 20.0–28.0)
O2 Saturation: 36.5 %
Patient temperature: 36
pCO2, Ven: 41 mmHg — ABNORMAL LOW (ref 44–60)
pH, Ven: 7.57 — ABNORMAL HIGH (ref 7.25–7.43)
pO2, Ven: <31 mmHg — CL (ref 32–45)

## 2022-11-22 MED ORDER — LIDOCAINE VISCOUS HCL 2 % MT SOLN
15.0000 mL | Freq: Once | OROMUCOSAL | Status: AC
  Administered 2022-11-22: 15 mL via ORAL
  Filled 2022-11-22: qty 15

## 2022-11-22 MED ORDER — LACTATED RINGERS IV BOLUS: 500.0000 mL | Freq: Once | INTRAVENOUS | Status: DC

## 2022-11-22 MED ORDER — ALUM & MAG HYDROXIDE-SIMETH 200-200-20 MG/5ML PO SUSP
15.0000 mL | ORAL | Status: DC | PRN
  Administered 2022-11-22: 15 mL via ORAL
  Filled 2022-11-22: qty 30

## 2022-11-22 MED ORDER — ALUM & MAG HYDROXIDE-SIMETH 200-200-20 MG/5ML PO SUSP
30.0000 mL | Freq: Once | ORAL | Status: AC
  Administered 2022-11-22: 30 mL via ORAL
  Filled 2022-11-22: qty 30

## 2022-11-22 MED ORDER — MELATONIN 3 MG PO TABS
3.0000 mg | ORAL_TABLET | Freq: Every day | ORAL | Status: DC
  Administered 2022-11-22 – 2022-11-23 (×2): 3 mg via ORAL
  Filled 2022-11-22 (×2): qty 1

## 2022-11-22 MED ORDER — ACETAMINOPHEN 500 MG PO TABS
1000.0000 mg | ORAL_TABLET | Freq: Four times a day (QID) | ORAL | Status: DC | PRN
  Administered 2022-11-22 – 2022-11-24 (×3): 1000 mg via ORAL
  Filled 2022-11-22 (×4): qty 2

## 2022-11-22 NOTE — Progress Notes (Signed)
Pt found laying in shower with hot water running with pt head on lip of shower stall with his eyes closed.  Pt stated he did NOT fall and that hot water on abd was the only thing that helped with his abd pain and nausea.  Pt agreed to try heat packs on abd. Asked pt nurse to help pt get up due to wet conditions. Pt only pain medication is 1000 mg Tylenol PRN Q6 and has 8 mg Zofran PRN Q8H.

## 2022-11-22 NOTE — Progress Notes (Signed)
Subjective:   Summary: Tyler Bennett is a 52 y.o. year old male currently admitted on the IMTS HD#1 for AKI 2/2 to intractable vomiting.  Overnight Events: NOE   Pt was seen this AM bedside. He is complaining of deep abdominal pain in the epigastric region, as well as muscle cramping in the lower extremities bilaterally. He says he has been able to tolerate his fluid diet well, with no episodes of nausea or vomiting.   Objective:  Vital signs in last 24 hours: Vitals:   11/21/22 1619 11/21/22 2141 11/22/22 0521 11/22/22 0817  BP: (!) 154/93 106/75 117/84 (!) 146/100  Pulse: 69 62 92 65  Resp: '18 17 17 16  '$ Temp: 97.8 F (36.6 C) 98 F (36.7 C) 98.1 F (36.7 C) 97.7 F (36.5 C)  TempSrc: Oral  Oral Oral  SpO2: 100% 96% 100% 98%   Supplemental O2: Room Air SpO2: 98 % O2 Flow Rate (L/min): 0 L/min   Physical Exam:  Constitutional: Uncomfortable male, laying in bed  Cardiovascular: RRR, no murmurs, rubs or gallops Pulmonary/Chest: normal work of breathing on room air, lungs clear to auscultation bilaterally Abdominal: soft, non-tender, non-distended, normal active bowel sounds, nontender to palpation Skin: warm and dry Extremities: upper/lower extremity pulses 2+, no lower extremity edema present, lower extremity muscle tightness present  There were no vitals filed for this visit.   Intake/Output Summary (Last 24 hours) at 11/22/2022 1220 Last data filed at 11/22/2022 0900 Gross per 24 hour  Intake 120 ml  Output 800 ml  Net -680 ml   Net IO Since Admission: -680 mL [11/22/22 1220]  Pertinent Labs:    Latest Ref Rng & Units 11/22/2022    2:25 AM 11/21/2022    1:58 PM 11/21/2022    8:49 AM  CBC  WBC 4.0 - 10.5 K/uL 10.6   17.0   Hemoglobin 13.0 - 17.0 g/dL 16.8  18.0  18.5   Hematocrit 39.0 - 52.0 % 48.4  53.0  50.7   Platelets 150 - 400 K/uL 213   273        Latest Ref Rng & Units 11/22/2022    2:25 AM 11/21/2022    1:58 PM 11/21/2022    8:49 AM   CMP  Glucose 70 - 99 mg/dL 117   273   BUN 6 - 20 mg/dL 26   37   Creatinine 0.61 - 1.24 mg/dL 1.20   2.43   Sodium 135 - 145 mmol/L 133  134  131   Potassium 3.5 - 5.1 mmol/L 3.7  3.6  3.5   Chloride 98 - 111 mmol/L 95   93   CO2 22 - 32 mmol/L 25   18   Calcium 8.9 - 10.3 mg/dL 9.1   10.2   Total Protein 6.5 - 8.1 g/dL   8.4   Total Bilirubin 0.3 - 1.2 mg/dL   0.9   Alkaline Phos 38 - 126 U/L   99   AST 15 - 41 U/L   48   ALT 0 - 44 U/L   26      Imaging: CT Renal Stone Study  Result Date: 11/21/2022 CLINICAL DATA:  Upper abdominal pain and vomiting. EXAM: CT ABDOMEN AND PELVIS WITHOUT CONTRAST TECHNIQUE: Multidetector CT imaging of the abdomen and pelvis was performed following the standard protocol without IV contrast. RADIATION DOSE REDUCTION: This exam was performed according to  the departmental dose-optimization program which includes automated exposure control, adjustment of the mA and/or kV according to patient size and/or use of iterative reconstruction technique. COMPARISON:  05/25/2022 FINDINGS: Lower chest: Unremarkable. Hepatobiliary: No suspicious focal abnormality in the liver on this study without intravenous contrast. There is no evidence for gallstones, gallbladder wall thickening, or pericholecystic fluid. No intrahepatic or extrahepatic biliary dilation. Pancreas: No focal mass lesion. No dilatation of the main duct. No intraparenchymal cyst. No peripancreatic edema. Spleen: No splenomegaly. No focal mass lesion. Adrenals/Urinary Tract: No adrenal nodule or mass. Kidneys unremarkable. No evidence for hydroureter. The urinary bladder appears normal for the degree of distention. Stomach/Bowel: Stomach is unremarkable. No gastric wall thickening. No evidence of outlet obstruction. Duodenum is normally positioned as is the ligament of Treitz. No small bowel wall thickening. No small bowel dilatation. The appendix is normal. The terminal ileum is normal. No gross colonic mass. No  colonic wall thickening. Vascular/Lymphatic: There is mild atherosclerotic calcification of the abdominal aorta without aneurysm. There is no gastrohepatic or hepatoduodenal ligament lymphadenopathy. No retroperitoneal or mesenteric lymphadenopathy. No pelvic sidewall lymphadenopathy. Reproductive: The prostate gland and seminal vesicles are unremarkable. Other: No intraperitoneal free fluid. Musculoskeletal: No worrisome lytic or sclerotic osseous abnormality. IMPRESSION: 1. No acute findings in the abdomen or pelvis. Specifically, no findings to explain the patient's history of upper abdominal pain and vomiting. 2.  Aortic Atherosclerosis (ICD10-I70.0). Electronically Signed   By: Misty Schlag M.D.   On: 11/21/2022 12:29     Assessment/Plan:   Principal Problem:   AKI (acute kidney injury) ([place]) Active Problems:   Intractable nausea and vomiting   Hyperglycemia   Patient Summary: Tyler Bennett is a 52 y.o. with no pertinent past medical history who presented with intractable nausea/vomiting for 3 days and admitted for acute kidney injury in the setting of hypovolemia.    #AKI secondary to hypovolemia in the setting of intractable vomiting (resolved) Patient presented with 3-day history of vomiting and decreased p.o. intake.  Vomiting episodes have not occurred while he has been in the hospital, he also denies any nausea.  He is currently complaining of abdominal pain and cramps in his feet bilaterally specifically.  The abdominal pain is relatively similar to the one he was complaining about he was feeling prior to admission, and feels like there is a hole in his stomach.  He has been given 4 L of LR so far, do not think he is dehydrated at the moment and will not give more.  He was able to urinate around 600 mL overnight. Creatinine has come down from 2.43 to 1.2.  Still unsure of etiology of the vomiting and the abdominal cramps.  Could be gastroenteritis after eating a burger, could also be  an undiagnosed diabetes with relatively high glucose levels.  Obtaining an A1c to evaluate.  Plan: - Supportive treatment with Zofran - Started Maalox w/ lidocaine for abdominal pain - Follow-up A1c - Will stop IV fluids - Encourage patient to ambulate - Advance diet as tolerated    Diet: Normal IVF: None,None VTE: Xarelto Code: Full    Dispo: Anticipated discharge to Home in 3 days pending medical management.   Drucie Opitz, MD PGY-1 Internal Medicine Resident Pager Number [phone] Please contact the on call pager after 5 pm and on weekends at [phone].

## 2022-11-23 DIAGNOSIS — D179 Benign lipomatous neoplasm, unspecified: Secondary | ICD-10-CM | POA: Diagnosis present

## 2022-11-23 DIAGNOSIS — E872 Acidosis, unspecified: Secondary | ICD-10-CM | POA: Diagnosis present

## 2022-11-23 DIAGNOSIS — Z1152 Encounter for screening for COVID-19: Secondary | ICD-10-CM | POA: Diagnosis not present

## 2022-11-23 DIAGNOSIS — R1013 Epigastric pain: Secondary | ICD-10-CM | POA: Diagnosis present

## 2022-11-23 DIAGNOSIS — R112 Nausea with vomiting, unspecified: Secondary | ICD-10-CM

## 2022-11-23 DIAGNOSIS — R109 Unspecified abdominal pain: Secondary | ICD-10-CM | POA: Diagnosis not present

## 2022-11-23 DIAGNOSIS — E860 Dehydration: Secondary | ICD-10-CM | POA: Diagnosis present

## 2022-11-23 DIAGNOSIS — Z7151 Drug abuse counseling and surveillance of drug abuser: Secondary | ICD-10-CM | POA: Diagnosis not present

## 2022-11-23 DIAGNOSIS — K5900 Constipation, unspecified: Secondary | ICD-10-CM

## 2022-11-23 DIAGNOSIS — N179 Acute kidney failure, unspecified: Secondary | ICD-10-CM | POA: Diagnosis present

## 2022-11-23 DIAGNOSIS — E861 Hypovolemia: Secondary | ICD-10-CM | POA: Diagnosis present

## 2022-11-23 DIAGNOSIS — M62838 Other muscle spasm: Secondary | ICD-10-CM | POA: Diagnosis not present

## 2022-11-23 DIAGNOSIS — F17210 Nicotine dependence, cigarettes, uncomplicated: Secondary | ICD-10-CM | POA: Diagnosis present

## 2022-11-23 DIAGNOSIS — R34 Anuria and oliguria: Secondary | ICD-10-CM | POA: Diagnosis present

## 2022-11-23 DIAGNOSIS — F1290 Cannabis use, unspecified, uncomplicated: Secondary | ICD-10-CM | POA: Diagnosis present

## 2022-11-23 DIAGNOSIS — Z79899 Other long term (current) drug therapy: Secondary | ICD-10-CM | POA: Diagnosis not present

## 2022-11-23 DIAGNOSIS — R739 Hyperglycemia, unspecified: Secondary | ICD-10-CM | POA: Diagnosis present

## 2022-11-23 LAB — BASIC METABOLIC PANEL
Anion gap: 10 (ref 5–15)
BUN: 22 mg/dL — ABNORMAL HIGH (ref 6–20)
CO2: 26 mmol/L (ref 22–32)
Calcium: 8.9 mg/dL (ref 8.9–10.3)
Chloride: 98 mmol/L (ref 98–111)
Creatinine, Ser: 1.16 mg/dL (ref 0.61–1.24)
GFR, Estimated: >60 mL/min (ref >60)
Glucose, Bld: 122 mg/dL — ABNORMAL HIGH (ref 70–99)
Potassium: 3.9 mmol/L (ref 3.5–5.1)
Sodium: 134 mmol/L — ABNORMAL LOW (ref 135–145)

## 2022-11-23 LAB — HEMOGLOBIN A1C
Hgb A1c MFr Bld: 5.9 % — ABNORMAL HIGH (ref 4.8–5.6)
Mean Plasma Glucose: 123 mg/dL

## 2022-11-23 MED ORDER — DICYCLOMINE HCL 10 MG PO CAPS
20.0000 mg | ORAL_CAPSULE | Freq: Once | ORAL | Status: AC
  Administered 2022-11-23: 20 mg via ORAL
  Filled 2022-11-23: qty 2

## 2022-11-23 MED ORDER — ALUM & MAG HYDROXIDE-SIMETH 200-200-20 MG/5ML PO SUSP
30.0000 mL | Freq: Once | ORAL | Status: AC
  Administered 2022-11-23: 30 mL via ORAL
  Filled 2022-11-23: qty 30

## 2022-11-23 MED ORDER — POLYETHYLENE GLYCOL 3350 17 G PO PACK
17.0000 g | PACK | Freq: Two times a day (BID) | ORAL | Status: DC
  Administered 2022-11-23 – 2022-11-24 (×2): 17 g via ORAL
  Filled 2022-11-23 (×2): qty 1

## 2022-11-23 MED ORDER — ALUM & MAG HYDROXIDE-SIMETH 200-200-20 MG/5ML PO SUSP
30.0000 mL | Freq: Four times a day (QID) | ORAL | Status: DC | PRN
  Administered 2022-11-24: 30 mL via ORAL
  Filled 2022-11-23: qty 30

## 2022-11-23 MED ORDER — SODIUM CHLORIDE 0.9 % IV SOLN: 12.5000 mg | Freq: Four times a day (QID) | INTRAVENOUS | Status: DC | PRN

## 2022-11-23 MED ORDER — LIDOCAINE VISCOUS HCL 2 % MT SOLN
15.0000 mL | Freq: Once | OROMUCOSAL | Status: AC
  Administered 2022-11-23: 15 mL via ORAL
  Filled 2022-11-23 (×2): qty 15

## 2022-11-23 MED ORDER — POLYETHYLENE GLYCOL 3350 17 G PO PACK
17.0000 g | PACK | Freq: Every day | ORAL | Status: DC
  Administered 2022-11-23: 17 g via ORAL
  Filled 2022-11-23: qty 1

## 2022-11-23 MED ORDER — SENNOSIDES-DOCUSATE SODIUM 8.6-50 MG PO TABS: 1.0000 | ORAL_TABLET | Freq: Every evening | ORAL | Status: DC | PRN

## 2022-11-23 MED ORDER — PROMETHAZINE HCL 12.5 MG RE SUPP: 12.5000 mg | Freq: Two times a day (BID) | RECTAL | Status: DC | PRN

## 2022-11-23 MED ORDER — METHOCARBAMOL 1000 MG/10ML IJ SOLN
500.0000 mg | Freq: Once | INTRAVENOUS | Status: AC
  Administered 2022-11-23: 500 mg via INTRAVENOUS
  Filled 2022-11-23: qty 5
  Filled 2022-11-23: qty 500

## 2022-11-23 MED ORDER — SODIUM CHLORIDE 0.9 % IV BOLUS
1000.0000 mL | Freq: Once | INTRAVENOUS | Status: AC
  Administered 2022-11-23: 1000 mL via INTRAVENOUS

## 2022-11-23 MED ORDER — LIDOCAINE VISCOUS HCL 2 % MT SOLN
15.0000 mL | Freq: Once | OROMUCOSAL | Status: AC
  Administered 2022-11-23: 15 mL via ORAL
  Filled 2022-11-23: qty 15

## 2022-11-23 MED ORDER — PROMETHAZINE HCL 12.5 MG PO TABS
12.5000 mg | ORAL_TABLET | Freq: Four times a day (QID) | ORAL | Status: DC | PRN
  Administered 2022-11-23 – 2022-11-24 (×2): 12.5 mg via ORAL
  Filled 2022-11-23 (×3): qty 1

## 2022-11-23 MED ORDER — OXYCODONE HCL 5 MG PO TABS: 5.0000 mg | ORAL_TABLET | Freq: Once | ORAL | Status: DC

## 2022-11-23 MED ORDER — PANTOPRAZOLE SODIUM 40 MG PO TBEC
40.0000 mg | DELAYED_RELEASE_TABLET | Freq: Every day | ORAL | Status: DC
  Administered 2022-11-23 – 2022-11-24 (×2): 40 mg via ORAL
  Filled 2022-11-23 (×2): qty 1

## 2022-11-23 MED ORDER — SENNOSIDES-DOCUSATE SODIUM 8.6-50 MG PO TABS
1.0000 | ORAL_TABLET | Freq: Once | ORAL | Status: AC
  Administered 2022-11-23: 1 via ORAL
  Filled 2022-11-23: qty 1

## 2022-11-23 MED ORDER — BISACODYL 10 MG RE SUPP: 10.0000 mg | Freq: Every day | RECTAL | Status: DC | PRN

## 2022-11-23 MED ORDER — SENNOSIDES-DOCUSATE SODIUM 8.6-50 MG PO TABS
1.0000 | ORAL_TABLET | Freq: Two times a day (BID) | ORAL | Status: DC
  Administered 2022-11-23 (×2): 1 via ORAL
  Filled 2022-11-23 (×2): qty 1

## 2022-11-23 NOTE — Progress Notes (Signed)
Subjective:   Summary: SYMIR MAH is a 52 y.o. year old male currently admitted on the IMTS HD#2 for vomiting, nausea, and abdominal pain.  Overnight Events: NOE   Pt was seen this AM bedside. He did not eat anything yesterday after trying to eat a banana and having an episode of vomiting. He was able to drink some liquids today, including a freezer pop and ice cream and tolerated it well. He is still feeling nauseous, and has not had a bowel movement although has been urinating well. He is still complaining of "an acorn in the middle of my stomach", and currently rates it a 4/10.    Objective:  Vital signs in last 24 hours: Vitals:   11/22/22 0817 11/22/22 1542 11/22/22 2054 11/23/22 0814  BP: (!) 146/100 (!) 141/77 112/84 122/88  Pulse: 65 (!) 57 73 63  Resp: '16 16 19 16  '$ Temp: 97.7 F (36.5 C) 97.6 F (36.4 C) 98.1 F (36.7 C) 97.7 F (36.5 C)  TempSrc: Oral Oral Oral Oral  SpO2: 98% 95% 95% 100%   Supplemental O2: Room Air SpO2: 100 % O2 Flow Rate (L/min): 0 L/min   Physical Exam:  Constitutional: Uncomfortable male, laying in bed  Cardiovascular: RRR, no murmurs, rubs or gallops Pulmonary/Chest: normal work of breathing on room air, lungs clear to auscultation bilaterally Abdominal: soft, non-tender, non-distended, normal active bowel sounds, mild tenderness in epigastric region and RUQ. Skin: warm and dry Extremities: upper/lower extremity pulses 2+, no lower extremity edema present, lower extremity muscle tightness present    Pertinent Labs:    Latest Ref Rng & Units 11/22/2022    2:25 AM 11/21/2022    1:58 PM 11/21/2022    8:49 AM  CBC  WBC 4.0 - 10.5 K/uL 10.6   17.0   Hemoglobin 13.0 - 17.0 g/dL 16.8  18.0  18.5   Hematocrit 39.0 - 52.0 % 48.4  53.0  50.7   Platelets 150 - 400 K/uL 213   273        Latest Ref Rng & Units 11/23/2022    6:39 AM 11/22/2022    2:25 AM 11/21/2022    1:58 PM  CMP  Glucose 70 - 99 mg/dL 122  117    BUN 6 -  20 mg/dL 22  26    Creatinine 0.61 - 1.24 mg/dL 1.16  1.20    Sodium 135 - 145 mmol/L 134  133  134   Potassium 3.5 - 5.1 mmol/L 3.9  3.7  3.6   Chloride 98 - 111 mmol/L 98  95    CO2 22 - 32 mmol/L 26  25    Calcium 8.9 - 10.3 mg/dL 8.9  9.1       Imaging: No results found.   Assessment/Plan:   Principal Problem:   AKI (acute kidney injury) ([place]) Active Problems:   Intractable nausea and vomiting   Hyperglycemia   Patient Summary: EMRAH ARIOLA is a 52 y.o. with no pertinent past medical history who presented with intractable nausea/vomiting for 3 days and admitted for acute kidney injury in the setting of hypovolemia.    #Nausea, Vomiting, Abdominal Cramps #Constipation Patient presented with 3-day history of vomiting and decreased p.o. intake.  CT abdomen self to rule out pancreatitis and gallstones. After attempting to advance diet yesterday, he vomited after taking one bite of a banana. States the Zofran has been  helping slightly. Maalox/lidocaine solution provided great relief for him and he was able to sleep.  He is still complaining of abdominal pain located in the epigastric region, currently at a 4 out of 10.  Abdominal pain is consistent with the pain he has been feeling for the last 3 days.  Overnight, he started developing muscle spasm and required  Bentyl which provided him with relief.  Patient has been urinating well, with no dysuria.  He has not had a bowel movement in 4 days now, so I suspect constipation may be playing a role in his abdominal discomfort.  Gastritis also on the differential as Maalox and lidocaine provided relief.  Plan:  - Continue Maalox and Lidocaine  - Start bowel regimen with miralax BID and senna twice a day  - Start protonix '40mg'$  once a day.  - Switch Zofran to Phenergen as has less side effect for constipation.  - F/U A1c  .   #AKI secondary to hypovolemia in the setting of intractable vomiting (resolved) Initial creatinine on  admission to the emergency department was 2.43, improved to 1.2 after given 4 L of LR fluid resuscitation.  Most recent creatinine is 1.16.  Will continue to monitor.   #Lipoma on Right Upper Extremity  Patient has lipoma present on right upper extremity.  He is scheduled for excision of it on January 27.   Diet: Normal IVF: None,None VTE: Xarelto Code: Full    Dispo: Anticipated discharge to Home in 3 days pending medical management.   Drucie Opitz, MD PGY-1 Internal Medicine Resident Pager Number [phone] Please contact the on call pager after 5 pm and on weekends at [phone].

## 2022-11-23 NOTE — Progress Notes (Signed)
Notified by RN for severe abdominal pain.  When evaluated bedside, patient is not in any visible distress.  He reports severe epigastric pain with cramping of bilateral calfs and arms.  He reports inability to tolerate p.o. intake due to nausea/vomiting.  His emesis is nonbilious and nonbloody.  His last bowel movement was 4-5 days ago.  He is still passing gas.  Abdomen tender to palpation mostly in the epigastric region.  Negative Murphy sign.  Bowel sound present.  No abdominal skin changes.  Patient may had a flare of his chronic abdominal condition possible gastroparesis vs hyperemesis from marijuana use.  No signs of bowel obstruction on exam.  Will trial 1 dose of Bentyl for his abdominal cramping and Robaxin for his muscle cramps.  Will reassess in 1 hour.

## 2022-11-24 DIAGNOSIS — K5900 Constipation, unspecified: Secondary | ICD-10-CM | POA: Diagnosis not present

## 2022-11-24 DIAGNOSIS — R112 Nausea with vomiting, unspecified: Secondary | ICD-10-CM | POA: Diagnosis not present

## 2022-11-24 DIAGNOSIS — R109 Unspecified abdominal pain: Secondary | ICD-10-CM | POA: Diagnosis not present

## 2022-11-24 LAB — CBC
HCT: 45.3 % (ref 39.0–52.0)
Hemoglobin: 15.9 g/dL (ref 13.0–17.0)
MCH: 31.4 pg (ref 26.0–34.0)
MCHC: 35.1 g/dL (ref 30.0–36.0)
MCV: 89.5 fL (ref 80.0–100.0)
Platelets: 240 10*3/uL (ref 150–400)
RBC: 5.06 MIL/uL (ref 4.22–5.81)
RDW: 12.9 % (ref 11.5–15.5)
WBC: 9.5 10*3/uL (ref 4.0–10.5)
nRBC: 0 % (ref 0.0–0.2)

## 2022-11-24 LAB — BASIC METABOLIC PANEL
Anion gap: 11 (ref 5–15)
BUN: 21 mg/dL — ABNORMAL HIGH (ref 6–20)
CO2: 24 mmol/L (ref 22–32)
Calcium: 8.3 mg/dL — ABNORMAL LOW (ref 8.9–10.3)
Chloride: 100 mmol/L (ref 98–111)
Creatinine, Ser: 1.08 mg/dL (ref 0.61–1.24)
GFR, Estimated: >60 mL/min (ref >60)
Glucose, Bld: 132 mg/dL — ABNORMAL HIGH (ref 70–99)
Potassium: 3.4 mmol/L — ABNORMAL LOW (ref 3.5–5.1)
Sodium: 135 mmol/L (ref 135–145)

## 2022-11-24 LAB — MAGNESIUM: Magnesium: 2.2 mg/dL (ref 1.7–2.4)

## 2022-11-24 MED ORDER — SENNOSIDES-DOCUSATE SODIUM 8.6-50 MG PO TABS
2.0000 | ORAL_TABLET | Freq: Two times a day (BID) | ORAL | Status: DC
  Administered 2022-11-24: 2 via ORAL
  Filled 2022-11-24: qty 2

## 2022-11-24 MED ORDER — POTASSIUM CHLORIDE CRYS ER 20 MEQ PO TBCR
40.0000 meq | EXTENDED_RELEASE_TABLET | Freq: Two times a day (BID) | ORAL | Status: DC
  Administered 2022-11-24: 40 meq via ORAL
  Filled 2022-11-24: qty 2

## 2022-11-24 MED ORDER — ADULT MULTIVITAMIN W/MINERALS CH
1.0000 | ORAL_TABLET | Freq: Every day | ORAL | Status: DC
  Administered 2022-11-24: 1 via ORAL
  Filled 2022-11-24: qty 1

## 2022-11-24 NOTE — Progress Notes (Signed)
Initial Nutrition Assessment  DOCUMENTATION CODES:   Not applicable  INTERVENTION:  - Advance diet as tolerated.   - Add MVI q day.   NUTRITION DIAGNOSIS:   Inadequate oral intake related to inability to eat, nausea, vomiting as evidenced by per patient/family report.  GOAL:   Patient will meet greater than or equal to 90% of their needs  MONITOR:   PO intake  REASON FOR ASSESSMENT:   Malnutrition Screening Tool    ASSESSMENT:   52 y.o. male admits related to abdominal pain, nausea and vomiting. No PMH on file. Pt is currently receiving medical management for AKI.  Meds reviewed:  Miralax, klor-con, senokot. Labs reviewed: K low.   Pt reports that he has not been eating well and has been unable to keep any food down for the past week. He states that he had a BM today and he feels like he might be able to tolerate some of his full liquids now. RD offered supplements but pt states that he wanted to just attempt the food on his tray for now. RD will continue to monitor PO intakes.   NUTRITION - FOCUSED PHYSICAL EXAM:  WNL - no wasting noted.   Diet Order:   Diet Order             Diet full liquid Room service appropriate? Yes; Fluid consistency: Thin  Diet effective now                   EDUCATION NEEDS:   Not appropriate for education at this time  Skin:  Skin Assessment: Reviewed RN Assessment  Last BM:  11/24/22  Height:   Ht Readings from Last 1 Encounters:  11/21/22 '5\' 8"'$  (1.727 m)    Weight:   Wt Readings from Last 1 Encounters:  11/21/22 81.6 kg    Ideal Body Weight:     BMI:  There is no height or weight on file to calculate BMI.  Estimated Nutritional Needs:   Kcal:  4008-6761 kcals  Protein:  100-125 gm  Fluid:  >/= 2 L  Howie Rufus M Anmol Paschen,

## 2022-11-24 NOTE — Progress Notes (Addendum)
Subjective:   Summary: Tyler Bennett is a 52 y.o. year old male currently admitted on the IMTS HD#3 for vomiting, nausea, and abdominal pain.  Overnight Events: NOE   Pt was seen this AM bedside.  He was able to have two bowel movements yesterday.  He said the first one was very large volume: Second one was minimal.  No blood seen, yellow color diarrhea.  Abdominal discomfort has shifted locations from epigastric region to lower quadrant.  He says the discomfort has improved when bowel movements were had.  He had one episode of vomiting yesterday, after eating too much ice cream.   Objective:  Vital signs in last 24 hours: Vitals:   11/23/22 0814 11/23/22 2019 11/24/22 0450 11/24/22 0804  BP: 122/88 (!) 146/94 (!) 131/96 (!) 143/86  Pulse: 63 66 70 63  Resp: '16 16 16 16  '$ Temp: 97.7 F (36.5 C) 99.5 F (37.5 C) 98.2 F (36.8 C) 98.2 F (36.8 C)  TempSrc: Oral Oral  Oral  SpO2: 100% 99% 98% 98%   Supplemental O2: Room Air SpO2: 98 % O2 Flow Rate (L/min): 0 L/min   Physical Exam:  Constitutional: Well-appearing male, laying in bed, in no acute distress Cardiovascular: RRR, no murmurs, rubs or gallops Pulmonary/Chest: normal work of breathing on room air, lungs clear to auscultation bilaterally Abdominal: soft, non-tender, non-distended, normal active bowel sounds, mild tenderness in lower quadrant Skin: warm and dry Extremities: No muscle tenderness present    Pertinent Labs:    Latest Ref Rng & Units 11/24/2022    3:21 AM 11/22/2022    2:25 AM 11/21/2022    1:58 PM  CBC  WBC 4.0 - 10.5 K/uL 9.5  10.6    Hemoglobin 13.0 - 17.0 g/dL 15.9  16.8  18.0   Hematocrit 39.0 - 52.0 % 45.3  48.4  53.0   Platelets 150 - 400 K/uL 240  213         Latest Ref Rng & Units 11/24/2022    3:21 AM 11/23/2022    6:39 AM 11/22/2022    2:25 AM  CMP  Glucose 70 - 99 mg/dL 132  122  117   BUN 6 - 20 mg/dL '21  22  26   '$ Creatinine 0.61 - 1.24 mg/dL 1.08  1.16  1.20   Sodium  135 - 145 mmol/L 135  134  133   Potassium 3.5 - 5.1 mmol/L 3.4  3.9  3.7   Chloride 98 - 111 mmol/L 100  98  95   CO2 22 - 32 mmol/L '24  26  25   '$ Calcium 8.9 - 10.3 mg/dL 8.3  8.9  9.1      Imaging: No results found.   Assessment/Plan:   Principal Problem:   AKI (acute kidney injury) ([place]) Active Problems:   Nausea and vomiting   Hyperglycemia   Patient Summary: Tyler Bennett is a 52 y.o. with no pertinent past medical history who presented with intractable nausea/vomiting for 3 days and admitted for acute kidney injury in the setting of hypovolemia.    #Nausea, Vomiting, Abdominal Cramps #Constipation Patient presented with 3-day history of vomiting and decreased p.o. intake.  CT abdomen ruled out pancreatitis and gallstones.  Maalox/lidocaine solution provided great relief for him.  Abdominal pain is now shifted from the epigastric region to lower quadrant.  However he states that has improved, and is more  like discomfort now.  He has not had any muscle spasms.  He did have an episode of vomiting after eating multiple ice creams and different flavors.  Patient also had a bowel movement for the first time, and once bowel movement was completed he stated he felt better.  A1c returned at 5.8, glucose has been only slightly elevated, little concern for gastroparesis at this time  Plan:  - Continue Maalox with lidocaine  - Continue bowel regimen with miralax BID and senna twice a day  - Continue protonix '40mg'$  once a day.  - Continue Phenergan prn.  - Can consider GI consult if symptoms do not improve - Will try to advance diet tomorrow, encourage patient to eat things slowly.   #AKI secondary to hypovolemia in the setting of intractable vomiting (resolved) Initial creatinine on admission to the emergency department was 2.43, improved to 1.2 after given 4 L of LR fluid resuscitation.  Most recent creatinine is 1.08 will continue to monitor.   #Lipoma on Right Upper Extremity   Patient has lipoma present on right upper extremity.  He is scheduled for excision of it on January 27.   Diet: Normal IVF: None,None VTE: Xarelto Code: Full    Dispo: Anticipated discharge to Home in 1 day pending medical management.   Drucie Opitz, MD PGY-1 Internal Medicine Resident Pager Number [phone] Please contact the on call pager after 5 pm and on weekends at [phone].

## 2022-11-24 NOTE — Progress Notes (Signed)
Patient states he was feeling better he has had 2 bowel movements and felt like he was strong enough to leave. States we weren't doing anything for him just laying here taking tylenol and maalox and he needed to get back to work.

## 2022-12-08 DIAGNOSIS — Z4789 Encounter for other orthopedic aftercare: Secondary | ICD-10-CM | POA: Insufficient documentation

## 2022-12-26 DIAGNOSIS — D1720 Benign lipomatous neoplasm of skin and subcutaneous tissue of unspecified limb: Secondary | ICD-10-CM | POA: Insufficient documentation

## 2023-09-09 ENCOUNTER — Emergency Department: Payer: Commercial Managed Care - PPO

## 2023-09-09 ENCOUNTER — Other Ambulatory Visit: Payer: Self-pay

## 2023-09-09 ENCOUNTER — Inpatient Hospital Stay
Admission: EM | Admit: 2023-09-09 | Discharge: 2023-09-11 | DRG: 392 | Disposition: A | Discharge status: Home / Self Care | Payer: Commercial Managed Care - PPO | Attending: Internal Medicine | Admitting: Internal Medicine

## 2023-09-09 DIAGNOSIS — F17290 Nicotine dependence, other tobacco product, uncomplicated: Secondary | ICD-10-CM | POA: Diagnosis present

## 2023-09-09 DIAGNOSIS — N179 Acute kidney failure, unspecified: Secondary | ICD-10-CM | POA: Diagnosis present

## 2023-09-09 DIAGNOSIS — D72829 Elevated white blood cell count, unspecified: Secondary | ICD-10-CM | POA: Diagnosis present

## 2023-09-09 DIAGNOSIS — Z8249 Family history of ischemic heart disease and other diseases of the circulatory system: Secondary | ICD-10-CM | POA: Diagnosis not present

## 2023-09-09 DIAGNOSIS — R1110 Vomiting, unspecified: Secondary | ICD-10-CM | POA: Diagnosis present

## 2023-09-09 DIAGNOSIS — R739 Hyperglycemia, unspecified: Secondary | ICD-10-CM | POA: Diagnosis present

## 2023-09-09 DIAGNOSIS — R112 Nausea with vomiting, unspecified: Secondary | ICD-10-CM | POA: Diagnosis present

## 2023-09-09 DIAGNOSIS — E872 Acidosis, unspecified: Secondary | ICD-10-CM | POA: Diagnosis present

## 2023-09-09 DIAGNOSIS — R7303 Prediabetes: Secondary | ICD-10-CM | POA: Diagnosis present

## 2023-09-09 DIAGNOSIS — Z833 Family history of diabetes mellitus: Secondary | ICD-10-CM | POA: Diagnosis not present

## 2023-09-09 DIAGNOSIS — E860 Dehydration: Secondary | ICD-10-CM | POA: Diagnosis present

## 2023-09-09 DIAGNOSIS — F1290 Cannabis use, unspecified, uncomplicated: Secondary | ICD-10-CM | POA: Diagnosis present

## 2023-09-09 DIAGNOSIS — F17210 Nicotine dependence, cigarettes, uncomplicated: Secondary | ICD-10-CM | POA: Diagnosis present

## 2023-09-09 DIAGNOSIS — F17200 Nicotine dependence, unspecified, uncomplicated: Secondary | ICD-10-CM | POA: Diagnosis present

## 2023-09-09 DIAGNOSIS — Z83438 Family history of other disorder of lipoprotein metabolism and other lipidemia: Secondary | ICD-10-CM

## 2023-09-09 DIAGNOSIS — R1084 Generalized abdominal pain: Principal | ICD-10-CM

## 2023-09-09 LAB — COMPREHENSIVE METABOLIC PANEL
ALT: 24 U/L (ref 0–44)
AST: 38 U/L (ref 15–41)
Albumin: 5.6 g/dL — ABNORMAL HIGH (ref 3.5–5.0)
Alkaline Phosphatase: 106 U/L (ref 38–126)
Anion gap: 19 — ABNORMAL HIGH (ref 5–15)
BUN: 36 mg/dL — ABNORMAL HIGH (ref 6–20)
CO2: 17 mmol/L — ABNORMAL LOW (ref 22–32)
Calcium: 10.6 mg/dL — ABNORMAL HIGH (ref 8.9–10.3)
Chloride: 99 mmol/L (ref 98–111)
Creatinine, Ser: 2.12 mg/dL — ABNORMAL HIGH (ref 0.61–1.24)
GFR, Estimated: 37 mL/min — ABNORMAL LOW (ref >60)
Glucose, Bld: 159 mg/dL — ABNORMAL HIGH (ref 70–99)
Potassium: 3.9 mmol/L (ref 3.5–5.1)
Sodium: 135 mmol/L (ref 135–145)
Total Bilirubin: 1.7 mg/dL — ABNORMAL HIGH (ref 0.3–1.2)
Total Protein: 9.4 g/dL — ABNORMAL HIGH (ref 6.5–8.1)

## 2023-09-09 LAB — CBC
HCT: 51.3 % (ref 39.0–52.0)
Hemoglobin: 18.3 g/dL — ABNORMAL HIGH (ref 13.0–17.0)
MCH: 31.6 pg (ref 26.0–34.0)
MCHC: 35.7 g/dL (ref 30.0–36.0)
MCV: 88.4 fL (ref 80.0–100.0)
Platelets: 331 10*3/uL (ref 150–400)
RBC: 5.8 MIL/uL (ref 4.22–5.81)
RDW: 13.2 % (ref 11.5–15.5)
WBC: 21.5 10*3/uL — ABNORMAL HIGH (ref 4.0–10.5)
nRBC: 0 % (ref 0.0–0.2)

## 2023-09-09 LAB — BLOOD GAS, VENOUS
Acid-Base Excess: 1.4 mmol/L (ref 0.0–2.0)
Bicarbonate: 24.9 mmol/L (ref 20.0–28.0)
O2 Saturation: 98.2 %
Patient temperature: 37
pCO2, Ven: 35 mm[Hg] — ABNORMAL LOW (ref 44–60)
pH, Ven: 7.46 — ABNORMAL HIGH (ref 7.25–7.43)
pO2, Ven: 92 mm[Hg] — ABNORMAL HIGH (ref 32–45)

## 2023-09-09 LAB — CBC WITH DIFFERENTIAL/PLATELET
Abs Immature Granulocytes: 0.14 10*3/uL — ABNORMAL HIGH (ref 0.00–0.07)
Basophils Absolute: 0.1 10*3/uL (ref 0.0–0.1)
Basophils Relative: 0 %
Eosinophils Absolute: 0 10*3/uL (ref 0.0–0.5)
Eosinophils Relative: 0 %
HCT: 52.9 % — ABNORMAL HIGH (ref 39.0–52.0)
Hemoglobin: 18.4 g/dL — ABNORMAL HIGH (ref 13.0–17.0)
Immature Granulocytes: 1 %
Lymphocytes Relative: 16 %
Lymphs Abs: 3.3 10*3/uL (ref 0.7–4.0)
MCH: 31.3 pg (ref 26.0–34.0)
MCHC: 34.8 g/dL (ref 30.0–36.0)
MCV: 90.1 fL (ref 80.0–100.0)
Monocytes Absolute: 1.9 10*3/uL — ABNORMAL HIGH (ref 0.1–1.0)
Monocytes Relative: 9 %
Neutro Abs: 15.7 10*3/uL — ABNORMAL HIGH (ref 1.7–7.7)
Neutrophils Relative %: 74 %
Platelets: 336 10*3/uL (ref 150–400)
RBC: 5.87 MIL/uL — ABNORMAL HIGH (ref 4.22–5.81)
RDW: 13.3 % (ref 11.5–15.5)
WBC: 21.2 10*3/uL — ABNORMAL HIGH (ref 4.0–10.5)
nRBC: 0 % (ref 0.0–0.2)

## 2023-09-09 LAB — CREATININE, URINE, RANDOM: Creatinine, Urine: 212 mg/dL

## 2023-09-09 LAB — CBG MONITORING, ED
Glucose-Capillary: 114 mg/dL — ABNORMAL HIGH (ref 70–99)
Glucose-Capillary: 141 mg/dL — ABNORMAL HIGH (ref 70–99)
Glucose-Capillary: 111 mg/dL — ABNORMAL HIGH (ref 70–99)
Glucose-Capillary: 114 mg/dL — ABNORMAL HIGH (ref 70–99)

## 2023-09-09 LAB — URINALYSIS, ROUTINE W REFLEX MICROSCOPIC
Bacteria, UA: NONE SEEN
Bilirubin Urine: NEGATIVE
Glucose, UA: NEGATIVE mg/dL
Ketones, ur: NEGATIVE mg/dL
Leukocytes,Ua: NEGATIVE
Nitrite: NEGATIVE
Protein, ur: 30 mg/dL — AB
Specific Gravity, Urine: 1.021 (ref 1.005–1.030)
pH: 5 (ref 5.0–8.0)

## 2023-09-09 LAB — LACTIC ACID, PLASMA
Lactic Acid, Venous: 1.4 mmol/L (ref 0.5–1.9)
Lactic Acid, Venous: 2.1 mmol/L (ref 0.5–1.9)
Lactic Acid, Venous: 1.9 mmol/L (ref 0.5–1.9)
Lactic Acid, Venous: 1.3 mmol/L (ref 0.5–1.9)

## 2023-09-09 LAB — LIPASE, BLOOD: Lipase: 29 U/L (ref 11–51)

## 2023-09-09 LAB — SODIUM, URINE, RANDOM: Sodium, Ur: 12 mmol/L

## 2023-09-09 LAB — HEMOGLOBIN A1C
Hgb A1c MFr Bld: 5.7 % — ABNORMAL HIGH (ref 4.8–5.6)
Mean Plasma Glucose: 116.89 mg/dL

## 2023-09-09 MED ORDER — SODIUM CHLORIDE 0.9 % IV SOLN
INTRAVENOUS | Status: AC
Start: 2023-09-09 — End: 2023-09-10

## 2023-09-09 MED ORDER — ONDANSETRON HCL 4 MG PO TABS: 4.0000 mg | ORAL_TABLET | Freq: Four times a day (QID) | ORAL | Status: DC | PRN

## 2023-09-09 MED ORDER — ONDANSETRON HCL 4 MG/2ML IJ SOLN
4.0000 mg | Freq: Once | INTRAMUSCULAR | Status: AC
  Administered 2023-09-09: 4 mg via INTRAVENOUS
  Filled 2023-09-09: qty 2

## 2023-09-09 MED ORDER — MORPHINE SULFATE (PF) 4 MG/ML IV SOLN
4.0000 mg | Freq: Once | INTRAVENOUS | Status: AC
  Administered 2023-09-09: 4 mg via INTRAVENOUS
  Filled 2023-09-09: qty 1

## 2023-09-09 MED ORDER — PIPERACILLIN-TAZOBACTAM 3.375 G IVPB 30 MIN
3.3750 g | Freq: Once | INTRAVENOUS | Status: AC
  Administered 2023-09-09: 3.375 g via INTRAVENOUS
  Filled 2023-09-09: qty 50

## 2023-09-09 MED ORDER — ONDANSETRON 4 MG PO TBDP
4.0000 mg | ORAL_TABLET | Freq: Once | ORAL | Status: AC
  Administered 2023-09-09: 4 mg via ORAL
  Filled 2023-09-09: qty 1

## 2023-09-09 MED ORDER — PANTOPRAZOLE SODIUM 40 MG IV SOLR
40.0000 mg | Freq: Two times a day (BID) | INTRAVENOUS | Status: DC
  Administered 2023-09-09 – 2023-09-10 (×3): 40 mg via INTRAVENOUS
  Filled 2023-09-09 (×3): qty 10

## 2023-09-09 MED ORDER — ENOXAPARIN SODIUM 40 MG/0.4ML IJ SOSY
40.0000 mg | PREFILLED_SYRINGE | INTRAMUSCULAR | Status: DC
  Administered 2023-09-09 – 2023-09-10 (×2): 40 mg via SUBCUTANEOUS
  Filled 2023-09-09 (×2): qty 0.4

## 2023-09-09 MED ORDER — NICOTINE 14 MG/24HR TD PT24
14.0000 mg | MEDICATED_PATCH | Freq: Every day | TRANSDERMAL | Status: DC
  Filled 2023-09-09: qty 1

## 2023-09-09 MED ORDER — LACTATED RINGERS IV BOLUS
500.0000 mL | Freq: Once | INTRAVENOUS | Status: AC
  Administered 2023-09-09: 500 mL via INTRAVENOUS

## 2023-09-09 MED ORDER — PANTOPRAZOLE SODIUM 40 MG IV SOLR: 40.0000 mg | Freq: Two times a day (BID) | INTRAVENOUS | Status: DC

## 2023-09-09 MED ORDER — INSULIN ASPART 100 UNIT/ML IJ SOLN
0.0000 [IU] | INTRAMUSCULAR | Status: DC
  Administered 2023-09-09: 1 [IU] via SUBCUTANEOUS
  Filled 2023-09-09: qty 1

## 2023-09-09 MED ORDER — PROMETHAZINE HCL 25 MG/ML IJ SOLN
12.5000 mg | Freq: Four times a day (QID) | INTRAMUSCULAR | Status: AC | PRN
  Administered 2023-09-09: 12.5 mg via INTRAVENOUS
  Filled 2023-09-09: qty 12.5

## 2023-09-09 MED ORDER — ONDANSETRON HCL 4 MG/2ML IJ SOLN
4.0000 mg | Freq: Four times a day (QID) | INTRAMUSCULAR | Status: DC | PRN
  Administered 2023-09-09 – 2023-09-11 (×5): 4 mg via INTRAVENOUS
  Filled 2023-09-09 (×5): qty 2

## 2023-09-09 MED ORDER — MORPHINE SULFATE (PF) 2 MG/ML IV SOLN
2.0000 mg | INTRAVENOUS | Status: AC | PRN
  Administered 2023-09-10: 2 mg via INTRAVENOUS
  Filled 2023-09-09: qty 1

## 2023-09-09 MED ORDER — SODIUM CHLORIDE 0.9 % IV BOLUS
1000.0000 mL | Freq: Once | INTRAVENOUS | Status: AC
  Administered 2023-09-09: 1000 mL via INTRAVENOUS

## 2023-09-09 MED ORDER — METOCLOPRAMIDE HCL 5 MG/ML IJ SOLN
5.0000 mg | Freq: Once | INTRAMUSCULAR | Status: AC
  Administered 2023-09-09: 5 mg via INTRAVENOUS
  Filled 2023-09-09: qty 2

## 2023-09-09 NOTE — ED Notes (Signed)
Patient having intermittent episodes of vomiting and at this time complaining of acid reflux/heartburn. Notified MD.

## 2023-09-09 NOTE — ED Triage Notes (Signed)
Pt c/o abd pain n/v x3days. Pt denies cough congestion or fevers.

## 2023-09-09 NOTE — Assessment & Plan Note (Signed)
Creatinine 2.1 today with GFR in the 30s Creatinine range looks to be between 1.2-2.4 over the past year  Clinically dry Will check FENa to correlate IV fluid hydration Hold nephrotoxic agents May benefit from nephrology referral outpatient

## 2023-09-09 NOTE — Assessment & Plan Note (Signed)
WBC 21 on presentation  No overt infectious process noted at present Suspect component of hemoconcentration as multiple cell lines are elevated and patient is clinically dry Pancultured in the ER Will otherwise monitor for now Reassess as appropriate

## 2023-09-09 NOTE — ED Provider Notes (Signed)
[facility] Provider Note    Event Date/Time   First MD Initiated Contact with Patient 09/09/23 [phone]     (approximate)   History   Abdominal Pain   HPI  Tyler Bennett is a 52 y.o. male who presents to the ED from home with a chief complaint of abdominal pain, nausea and vomiting x 3 days.  Patient reports prior to onset of symptoms he arrived to work needing to have a bowel movement but was unable to all day because he had at work.  Subsequently he had a large bowel movement accompanied with abdominal cramps and has been vomiting since.  Denies fever/chills, chest pain, shortness of breath, testicular pain or swelling.     Past Medical History  History reviewed. No pertinent past medical history.   Active Problem List   Patient Active Problem List   Diagnosis Date Noted   Hyperglycemia 11/22/2022   AKI (acute kidney injury) (HCC) 11/21/2022   Nausea and vomiting 05/23/2015   Smoker 05/23/2015     Past Surgical History   Past Surgical History:  Procedure Laterality Date   CARDIAC CATHETERIZATION N/A 05/24/2015   Procedure: Left Heart Cath and Coronary Angiography;  Surgeon: Tonny Bollman, MD;  Location: [facility] INVASIVE CV LAB;  Service: Cardiovascular;  Laterality: N/A;     Home Medications   Prior to Admission medications   Medication Sig Start Date End Date Taking? Authorizing Provider  Acetaminophen (TYLENOL PO) Take 2 tablets by mouth daily as needed (pain,fever, headache).    [provider]  pantoprazole sodium (PROTONIX) 40 mg Take 40 mg by mouth daily for 14 days. Patient not taking: Reported on 11/21/2022 06/02/22 01/23/23  Gilles Chiquito, MD     Allergies  Patient has no known allergies.   Family History   Family History  Problem Relation Age of Onset   Diabetes type II Mother    Heart failure Mother    Hyperlipidemia Father    CAD Other      Physical Exam  Triage Vital Signs: ED Triage Vitals  Encounter  Vitals Group     BP 09/09/23 0234 129/89     Systolic BP Percentile --      Diastolic BP Percentile --      Pulse Rate 09/09/23 0234 (!) 117     Resp 09/09/23 0234 18     Temp 09/09/23 0234 98.2 F (36.8 C)     Temp Source 09/09/23 0234 Oral     SpO2 09/09/23 0234 97 %     Weight 09/09/23 0233 195 lb (88.5 kg)     Height 09/09/23 0233 5\' 8"  (1.727 m)     Head Circumference --      Peak Flow --      Pain Score 09/09/23 0233 6     Pain Loc --      Pain Education --      Exclude from Growth Chart --     Updated Vital Signs: BP 129/89   Pulse (!) 117   Temp 97.8 F (36.6 C) (Oral)   Resp 18   Ht 5\' 8"  (1.727 m)   Wt 88.5 kg   SpO2 97%   BMI 29.65 kg/m    General: Awake, moderate distress.  CV:  Tachycardic.  Good peripheral perfusion.  Resp:  Normal effort.  CTAB. Abd:  Mild diffuse tenderness to palpation without rebound or guarding.  No distention.  Other:  No truncal vesicles.   ED Results /  Procedures / Treatments  Labs (all labs ordered are listed, but only abnormal results are displayed) Labs Reviewed  COMPREHENSIVE METABOLIC PANEL - Abnormal; Notable for the following components:      Result Value   CO2 17 (*)    Glucose, Bld 159 (*)    BUN 36 (*)    Creatinine, Ser 2.12 (*)    Calcium 10.6 (*)    Total Protein 9.4 (*)    Albumin 5.6 (*)    Total Bilirubin 1.7 (*)    GFR, Estimated 37 (*)    Anion gap 19 (*)    All other components within normal limits  CBC - Abnormal; Notable for the following components:   WBC 21.5 (*)    Hemoglobin 18.3 (*)    All other components within normal limits  CBC WITH DIFFERENTIAL/PLATELET - Abnormal; Notable for the following components:   WBC 21.2 (*)    RBC 5.87 (*)    Hemoglobin 18.4 (*)    HCT 52.9 (*)    Neutro Abs 15.7 (*)    Monocytes Absolute 1.9 (*)    Abs Immature Granulocytes 0.14 (*)    All other components within normal limits  LIPASE, BLOOD  URINALYSIS, ROUTINE W REFLEX MICROSCOPIC     EKG  ED  ECG REPORT I, Alleta Avery J, the attending physician, personally viewed and interpreted this ECG.   Date: 09/09/2023  EKG Time: 0529  Rate: 86  Rhythm: normal sinus rhythm  Axis: Normal  Intervals:none  ST&T Change: Nonspecific    RADIOLOGY I have independently visualized and interpreted patient's imaging study as well as noted the radiology interpretation:  CT abdomen/pelvis: No acute process  Chest x-ray: Negative  Official radiology report(s): CT ABDOMEN PELVIS WO CONTRAST  Result Date: 09/09/2023 CLINICAL DATA:  52 year old male with abdominal pain, nausea vomiting for 3 days. EXAM: CT ABDOMEN AND PELVIS WITHOUT CONTRAST TECHNIQUE: Multidetector CT imaging of the abdomen and pelvis was performed following the standard protocol without IV contrast. RADIATION DOSE REDUCTION: This exam was performed according to the departmental dose-optimization program which includes automated exposure control, adjustment of the mA and/or kV according to patient size and/or use of iterative reconstruction technique. COMPARISON:  CT Abdomen and Pelvis 11/21/2022 and earlier. FINDINGS: Lower chest: Mildly lower lung volumes, but mild dependent right lung base atelectasis or scarring has not changed. No pericardial or pleural effusion. Hepatobiliary: Negative noncontrast liver and gallbladder. Pancreas: Negative. Spleen: Negative. Adrenals/Urinary Tract: Adrenal glands remain normal. Nonobstructed kidneys. Punctate renal vascular calcifications suspected. No convincing nephrolithiasis. No evidence of renal inflammation. Ureters are normal. Diminutive and negative bladder. Occasional pelvic phleboliths. Stomach/Bowel: Decompressed large bowel from the mid transverse colon distally. Occasional diverticula in the descending and sigmoid segments without active inflammation. Mildly redundant hepatic flexure and right colon are negative. Diminutive and normal appendix containing some residual contrast or other dense  enteric material (coronal image thirty-eight). Decompressed and negative terminal ileum. Nondilated small bowel and stomach. No free air, free fluid, mesenteric inflammation. Vascular/Lymphatic: Aortoiliac calcified atherosclerosis. Mildly tortuous infrarenal abdominal aorta. Vascular patency is not evaluated in the absence of IV contrast. No lymphadenopathy. Reproductive: Negative. Other: No pelvis free fluid. Musculoskeletal: Chronic or congenital left L5 pars fracture. No spondylolisthesis. No acute osseous abnormality identified. IMPRESSION: 1. No acute or inflammatory process in the noncontrast abdomen or pelvis. 2.  Aortic Atherosclerosis (ICD10-I70.0). Electronically Signed   By: Odessa Fleming M.D.   On: 09/09/2023 05:28   DG Chest Port 1 View  Result Date: 09/09/2023  CLINICAL DATA:  52 year old male with abdominal pain, nausea vomiting for 3 days. EXAM: PORTABLE CHEST 1 VIEW COMPARISON:  Chest radiographs 11/21/2022 and earlier. FINDINGS: Portable AP upright view at 0504 hours. Lung volumes and mediastinal contours are stable and within normal limits. Allowing for portable technique the lungs are clear. Visualized tracheal air column is within normal limits. Chronic right clavicle fracture. No acute osseous abnormality identified. Paucity of visible bowel gas in the abdomen. IMPRESSION: Negative portable chest. Electronically Signed   By: Odessa Fleming M.D.   On: 09/09/2023 05:24     PROCEDURES:  Critical Care performed: Yes, see critical care procedure note(s)  CRITICAL CARE Performed by: Irean Hong   Total critical care time: 30 minutes  Critical care time was exclusive of separately billable procedures and treating other patients.  Critical care was necessary to treat or prevent imminent or life-threatening deterioration.  Critical care was time spent personally by me on the following activities: development of treatment plan with patient and/or surrogate as well as nursing, discussions with  consultants, evaluation of patient's response to treatment, examination of patient, obtaining history from patient or surrogate, ordering and performing treatments and interventions, ordering and review of laboratory studies, ordering and review of radiographic studies, pulse oximetry and re-evaluation of patient's condition.   Marland Kitchen1-3 Lead EKG Interpretation  Performed by: Irean Hong, MD Authorized by: Irean Hong, MD     Interpretation: abnormal     ECG rate:  105   ECG rate assessment: tachycardic     Rhythm: sinus tachycardia     Ectopy: none     Conduction: normal   Comments:     Patient placed on cardiac monitor to evaluate for arrhythmias    MEDICATIONS ORDERED IN ED: Medications  piperacillin-tazobactam (ZOSYN) IVPB 3.375 g (3.375 g Intravenous New Bag/Given 09/09/23 0536)  ondansetron (ZOFRAN-ODT) disintegrating tablet 4 mg (4 mg Oral Given 09/09/23 0236)  sodium chloride 0.9 % bolus 1,000 mL (1,000 mLs Intravenous New Bag/Given 09/09/23 0426)  morphine (PF) 4 MG/ML injection 4 mg (4 mg Intravenous Given 09/09/23 0426)  ondansetron (ZOFRAN) injection 4 mg (4 mg Intravenous Given 09/09/23 0425)     IMPRESSION / MDM / ASSESSMENT AND PLAN / ED COURSE  I reviewed the triage vital signs and the nursing notes.                             52 year old male presenting with abdominal pain, nausea/vomiting.Differential diagnosis includes, but is not limited to, acute appendicitis, renal colic, testicular torsion, urinary tract infection/pyelonephritis, prostatitis,  epididymitis, diverticulitis, small bowel obstruction or ileus, colitis, abdominal aortic aneurysm, gastroenteritis, hernia, etc. I personally reviewed patient's records and note a hospitalization from 11/21/2022 for nausea and vomiting with metabolic acidosis.  Patient's presentation is most consistent with acute presentation with potential threat to life or bodily function.  The patient is on the cardiac monitor to  evaluate for evidence of arrhythmia and/or significant heart rate changes.  Laboratory results demonstrate leukocytosis of 22,000, AKI with Creatinine 2.12, elevated anion gap of 19 secondary to metabolic acidosis.  Will obtain chest x-ray for concerns of perforation, CT abdomen/pelvis.  Initiate IV fluid resuscitation, IV morphine for pain.  With IV Zofran for nausea.  Given leukocytosis and concern for perforation, will start IV Zosyn.  Dissipate hospitalization.  Clinical Course as of 09/09/23 0548  Thu Sep 09, 2023  1610 CT negative for acute abnormality.  Patient had a  similar hospitalization in January 2024.  Complains of persistent pain and nausea.  Will redose morphine, try Reglan.  Will discuss with hospitalist services for evaluation and admission. [JS]    Clinical Course User Index [JS] Irean Hong, MD     FINAL CLINICAL IMPRESSION(S) / ED DIAGNOSES   Final diagnoses:  Generalized abdominal pain  Nausea and vomiting, unspecified vomiting type  Metabolic acidosis  AKI (acute kidney injury) (HCC)     Rx / DC Orders   ED Discharge Orders     None        Note:  This document was prepared using Dragon voice recognition software and may include unintentional dictation errors.   Irean Hong, MD 09/09/23 [phone]

## 2023-09-09 NOTE — ED Notes (Signed)
This RN to bedside to assess pt. Pt. Currently sleeping, eyes closed, chest rise a fall even and unlabored. Will complete full assessment when pt. Wakes.

## 2023-09-09 NOTE — ED Notes (Signed)
Pt. Woke up and attempted to eat and drink, began vomiting and dry-heaving. This RN to bedside to assis pt. See MAR.

## 2023-09-09 NOTE — Assessment & Plan Note (Signed)
Blood sugars 130s to 150s Meeting criteria for formal diagnosis of type 2 diabetes SSI A1c Discussed importance of outpatient follow-up and blood sugar control

## 2023-09-09 NOTE — ED Notes (Signed)
Dr. Alvester Morin at bedside assessing pt.

## 2023-09-09 NOTE — ED Notes (Signed)
Pt. Still dry-heaving and state pain and cramping in stomach is awful. See MAR.

## 2023-09-09 NOTE — Assessment & Plan Note (Signed)
1/2 to 1 pack/day smoker Discussed cessation at length Nicotine patch Monitor

## 2023-09-09 NOTE — ED Notes (Signed)
Upon entering patients room he was having episodes of vomiting. Pt requesting medication for nausea/vomiting at this time.

## 2023-09-09 NOTE — H&P (Addendum)
History and Physical    Patient: Tyler Bennett JJO:[id number] DOB: 10-25-1971 DOA: 09/09/2023 DOS: the patient was seen and examined on 09/09/2023 PCP: Pcp, No  Patient coming from: Home  Chief Complaint:  Chief Complaint  Patient presents with   Abdominal Pain   HPI: Tyler Bennett is a 52 y.o. male with medical history significant of tobacco abuse, recurrent nausea and vomiting, hyperglycemia presenting with intractable nausea vomiting, abdominal pain.  Patient ports recurrent nausea vomiting proximately 3 days.  Emesis nonbloody nonbilious.  Patient reports episode occurred after having a large bowel movement at work.  No chest pain or shortness of breath.  Patient reports recurrent episodes of nausea vomiting over multiple years.  Has not had formal GI follow-up.  +1 pack/day smoker.  No reported alcohol use.  No reported illicit drug use. Presented to the ER afebrile, hemodynamically stable.  Satting well on room air.  White count 21.5, hemoglobin 18, platelets 331, VBG stable.  Creatinine 2.12.  Glucose 159.  Bicarb 17.  CT abdomen pelvis grossly within normal limits.  Chest x-ray within normal limits. Review of Systems: As mentioned in the history of present illness. All other systems reviewed and are negative. History reviewed. No pertinent past medical history. Past Surgical History:  Procedure Laterality Date   CARDIAC CATHETERIZATION N/A 05/24/2015   Procedure: Left Heart Cath and Coronary Angiography;  Surgeon: Tonny Bollman, MD;  Location: [facility] INVASIVE CV LAB;  Service: Cardiovascular;  Laterality: N/A;   Social History:  reports that he has been smoking. He does not have any smokeless tobacco history on file. He reports that he does not drink alcohol and does not use drugs.  No Known Allergies  Family History  Problem Relation Age of Onset   Diabetes type II Mother    Heart failure Mother    Hyperlipidemia Father    CAD Other     Prior to Admission medications    Medication Sig Start Date End Date Taking? Authorizing Provider  Acetaminophen (TYLENOL PO) Take 2 tablets by mouth daily as needed (pain,fever, headache).   Yes [provider]    Physical Exam: Vitals:   09/09/23 0830 09/09/23 0900 09/09/23 0930 09/09/23 0945  BP: (!) 157/94 124/84 (!) 151/97   Pulse: 78 73 83 74  Resp: 15 12 15 16   Temp:      TempSrc:      SpO2: 100% 94% 95% 94%  Weight:      Height:       Physical Exam Constitutional:      Appearance: He is obese.  HENT:     Head: Normocephalic and atraumatic.     Nose: Nose normal.     Mouth/Throat:     Mouth: Mucous membranes are moist.     Pharynx: Oropharynx is clear.  Eyes:     Pupils: Pupils are equal, round, and reactive to light.  Cardiovascular:     Rate and Rhythm: Normal rate and regular rhythm.  Pulmonary:     Effort: Pulmonary effort is normal.  Abdominal:     General: Bowel sounds are normal.  Musculoskeletal:        General: Normal range of motion.  Skin:    General: Skin is warm.  Neurological:     General: No focal deficit present.  Psychiatric:        Mood and Affect: Mood normal.     Data Reviewed:  There are no new results to review at this time.  Assessment and  Plan: Nausea and vomiting Presented with acute on chronic episode of intractable nausea and vomiting CT abdomen pelvis grossly within normal limits today Patient denies any active high-dose NSAID or alcohol use Looks to have likely undiagnosed type 2 diabetes in review of prior blood sugars ?  Element of symptomatic diabetic gastroparesis Placed on course of IV Zofran and Phenergan Consider trial of Reglan as well as gastric emptying study Recommended outpatient GI evaluation to which patient was in agreement Monitor  AKI (acute kidney injury) (HCC) Creatinine 2.1 today with GFR in the 30s Creatinine range looks to be between 1.2-2.4 over the past year  Clinically dry Will check FENa to correlate IV fluid  hydration Hold nephrotoxic agents May benefit from nephrology referral outpatient  Leukocytosis WBC 21 on presentation  No overt infectious process noted at present Suspect component of hemoconcentration as multiple cell lines are elevated and patient is clinically dry Pancultured in the ER Will otherwise monitor for now Reassess as appropriate  Hyperglycemia Blood sugars 130s to 150s Meeting criteria for formal diagnosis of type 2 diabetes SSI A1c Discussed importance of outpatient follow-up and blood sugar control  Smoker 1/2 to 1 pack/day smoker Discussed cessation at length Nicotine patch Monitor      Advance Care Planning:   Code Status: Full Code   Consults: None   Family Communication: No family at the bedside   Severity of Illness: The appropriate patient status for this patient is INPATIENT. Inpatient status is judged to be reasonable and necessary in order to provide the required intensity of service to ensure the patient's safety. The patient's presenting symptoms, physical exam findings, and initial radiographic and laboratory data in the context of their chronic comorbidities is felt to place them at high risk for further clinical deterioration. Furthermore, it is not anticipated that the patient will be medically stable for discharge from the hospital within 2 midnights of admission.   * I certify that at the point of admission it is my clinical judgment that the patient will require inpatient hospital care spanning beyond 2 midnights from the point of admission due to high intensity of service, high risk for further deterioration and high frequency of surveillance required.*  Author: Floydene Flock, MD 09/09/2023 11:15 AM  For on call review [email or web address].

## 2023-09-09 NOTE — Assessment & Plan Note (Addendum)
Presented with acute on chronic episode of intractable nausea and vomiting CT abdomen pelvis grossly within normal limits today Patient denies any active high-dose NSAID or alcohol use Looks to have likely undiagnosed type 2 diabetes in review of prior blood sugars ?  Element of symptomatic diabetic gastroparesis Placed on course of IV Zofran and Phenergan Consider trial of Reglan as well as gastric emptying study Start ppi Recommended outpatient GI evaluation to which patient was in agreement Monitor

## 2023-09-10 DIAGNOSIS — R112 Nausea with vomiting, unspecified: Secondary | ICD-10-CM | POA: Diagnosis not present

## 2023-09-10 LAB — GLUCOSE, CAPILLARY
Glucose-Capillary: 104 mg/dL — ABNORMAL HIGH (ref 70–99)
Glucose-Capillary: 106 mg/dL — ABNORMAL HIGH (ref 70–99)
Glucose-Capillary: 112 mg/dL — ABNORMAL HIGH (ref 70–99)
Glucose-Capillary: 94 mg/dL (ref 70–99)
Glucose-Capillary: 95 mg/dL (ref 70–99)
Glucose-Capillary: 98 mg/dL (ref 70–99)

## 2023-09-10 LAB — CBC
HCT: 44.3 % (ref 39.0–52.0)
Hemoglobin: 15.9 g/dL (ref 13.0–17.0)
MCH: 32.4 pg (ref 26.0–34.0)
MCHC: 35.9 g/dL (ref 30.0–36.0)
MCV: 90.2 fL (ref 80.0–100.0)
Platelets: 252 10*3/uL (ref 150–400)
RBC: 4.91 MIL/uL (ref 4.22–5.81)
RDW: 13.2 % (ref 11.5–15.5)
WBC: 12.9 10*3/uL — ABNORMAL HIGH (ref 4.0–10.5)
nRBC: 0 % (ref 0.0–0.2)

## 2023-09-10 LAB — COMPREHENSIVE METABOLIC PANEL
ALT: 22 U/L (ref 0–44)
AST: 30 U/L (ref 15–41)
Albumin: 4.1 g/dL (ref 3.5–5.0)
Alkaline Phosphatase: 74 U/L (ref 38–126)
Anion gap: 11 (ref 5–15)
BUN: 26 mg/dL — ABNORMAL HIGH (ref 6–20)
CO2: 21 mmol/L — ABNORMAL LOW (ref 22–32)
Calcium: 8.7 mg/dL — ABNORMAL LOW (ref 8.9–10.3)
Chloride: 106 mmol/L (ref 98–111)
Creatinine, Ser: 1.18 mg/dL (ref 0.61–1.24)
GFR, Estimated: >60 mL/min (ref >60)
Glucose, Bld: 117 mg/dL — ABNORMAL HIGH (ref 70–99)
Potassium: 3.6 mmol/L (ref 3.5–5.1)
Sodium: 138 mmol/L (ref 135–145)
Total Bilirubin: 1.4 mg/dL — ABNORMAL HIGH (ref 0.3–1.2)
Total Protein: 7 g/dL (ref 6.5–8.1)

## 2023-09-10 MED ORDER — POTASSIUM CHLORIDE 2 MEQ/ML IV SOLN
INTRAVENOUS | Status: DC
  Filled 2023-09-10 (×2): qty 1000

## 2023-09-10 MED ORDER — KCL-LACTATED RINGERS 20 MEQ/L IV SOLN: INTRAVENOUS | Status: DC

## 2023-09-10 MED ORDER — PROCHLORPERAZINE EDISYLATE 10 MG/2ML IJ SOLN
10.0000 mg | Freq: Once | INTRAMUSCULAR | Status: AC
  Administered 2023-09-10: 10 mg via INTRAVENOUS
  Filled 2023-09-10: qty 2

## 2023-09-10 MED ORDER — MELATONIN 5 MG PO TABS
5.0000 mg | ORAL_TABLET | Freq: Once | ORAL | Status: AC
  Administered 2023-09-10: 5 mg via ORAL
  Filled 2023-09-10: qty 1

## 2023-09-10 NOTE — Consult Note (Signed)
Wyline Mood , MD [address], [address], [place], [place], [address] [address], [address], [place], [place], [address] Phone: [phone]  Fax: [phone]  Consultation  Referring Provider:     Dr Myriam Forehand Primary Care Physician:  Pcp, No Primary Gastroenterologist:  None          Reason for Consultation:     abdominal pain   Date of Admission:  09/09/2023 Date of Consultation:  09/10/2023         HPI:   Tyler Bennett is a 52 y.o. male admitted on 09/09/2023 with nausea, vomiting and abdominal pain of 3 days duration. Says the symptoms began on Monday all of a sudden after a bowel movement had multiple episodes of nausea , vomiting followed by mid abdominal pain that continued till he came to the hospital . Since this morning the pain , nausea and vomiting have resolved and he has been able to eat a bananana. No new medications , no nsaid use, no sick contacts, never had loose stools or a change in bowel habits. Denies any THC use but he vapes.   On admission Hb 18.3 with WCC 21 , Cr 2.12 and norak LFT's.Lipase normal.    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 No pertinent past medical history.  Past Surgical History:  Procedure Laterality Date   CARDIAC CATHETERIZATION N/A 05/24/2015   Procedure: Left Heart Cath and Coronary Angiography;  Surgeon: Tonny Bollman, MD;  Location: [facility] INVASIVE CV LAB;  Service: Cardiovascular;  Laterality: N/A;    Prior to Admission medications   Medication Sig Start Date End Date Taking? Authorizing Provider  Acetaminophen (TYLENOL PO) Take 2 tablets by mouth daily as needed (pain,fever, headache).   Yes [provider]    Family History  Problem Relation Age of Onset   Diabetes type II Mother    Heart failure Mother    Hyperlipidemia Father    CAD Other      Social History   Tobacco Use   Smoking status: Every Day  Substance Use Topics    Alcohol use: No   Drug use: No    Allergies as of 09/09/2023   (No Known Allergies)    Review of Systems:    All systems reviewed and negative except where noted in HPI.   Physical Exam:  Vital signs in last 24 hours: Temp:  [97.8 F (36.6 C)-98.3 F (36.8 C)] 98.1 F (36.7 C) (10/25 0822) Pulse Rate:  [50-88] 59 (10/25 0822) Resp:  [14-18] 17 (10/25 0822) BP: (94-173)/(58-96) 155/85 (10/25 0822) SpO2:  [92 %-99 %] 98 % (10/25 0822) Last BM Date : 09/06/23 General:   Pleasant, cooperative in NAD Head:  Normocephalic and atraumatic. Eyes:   No icterus.   Conjunctiva pink. PERRLA. Ears:  Normal auditory acuity. Neck:  Supple; no masses or thyroidomegaly Lungs: Respirations even and unlabored. Lungs clear to auscultation bilaterally.   No wheezes, crackles, or rhonchi.  Heart:  Regular rate and rhythm;  Without murmur, clicks, rubs or gallops Abdomen:  Soft, nondistended, nontender. Normal bowel sounds. No appreciable masses or hepatomegaly.  No rebound or guarding.  Neurologic:  Alert and oriented x3;  grossly normal neurologically. Psych:  Alert and cooperative. Normal affect.  LAB RESULTS: Recent Labs    09/09/23 0235 09/10/23 0601  WBC 21.2*  21.5* 12.9*  HGB 18.4*  18.3* 15.9  HCT  52.9*  51.3 44.3  PLT 336  331 252   BMET Recent Labs    09/09/23 0235 09/10/23 0601  NA 135 138  K 3.9 3.6  CL 99 106  CO2 17* 21*  GLUCOSE 159* 117*  BUN 36* 26*  CREATININE 2.12* 1.18  CALCIUM 10.6* 8.7*   LFT Recent Labs    09/10/23 0601  PROT 7.0  ALBUMIN 4.1  AST 30  ALT 22  ALKPHOS 74  BILITOT 1.4*   PT/INR No results for input(s): "LABPROT", "INR" in the last 72 hours.  STUDIES: CT ABDOMEN PELVIS WO CONTRAST  Result Date: 09/09/2023 CLINICAL DATA:  52 year old male with abdominal pain, nausea vomiting for 3 days. EXAM: CT ABDOMEN AND PELVIS WITHOUT CONTRAST TECHNIQUE: Multidetector CT imaging of the abdomen and pelvis was performed following the  standard protocol without IV contrast. RADIATION DOSE REDUCTION: This exam was performed according to the departmental dose-optimization program which includes automated exposure control, adjustment of the mA and/or kV according to patient size and/or use of iterative reconstruction technique. COMPARISON:  CT Abdomen and Pelvis 11/21/2022 and earlier. FINDINGS: Lower chest: Mildly lower lung volumes, but mild dependent right lung base atelectasis or scarring has not changed. No pericardial or pleural effusion. Hepatobiliary: Negative noncontrast liver and gallbladder. Pancreas: Negative. Spleen: Negative. Adrenals/Urinary Tract: Adrenal glands remain normal. Nonobstructed kidneys. Punctate renal vascular calcifications suspected. No convincing nephrolithiasis. No evidence of renal inflammation. Ureters are normal. Diminutive and negative bladder. Occasional pelvic phleboliths. Stomach/Bowel: Decompressed large bowel from the mid transverse colon distally. Occasional diverticula in the descending and sigmoid segments without active inflammation. Mildly redundant hepatic flexure and right colon are negative. Diminutive and normal appendix containing some residual contrast or other dense enteric material (coronal image thirty-eight). Decompressed and negative terminal ileum. Nondilated small bowel and stomach. No free air, free fluid, mesenteric inflammation. Vascular/Lymphatic: Aortoiliac calcified atherosclerosis. Mildly tortuous infrarenal abdominal aorta. Vascular patency is not evaluated in the absence of IV contrast. No lymphadenopathy. Reproductive: Negative. Other: No pelvis free fluid. Musculoskeletal: Chronic or congenital left L5 pars fracture. No spondylolisthesis. No acute osseous abnormality identified. IMPRESSION: 1. No acute or inflammatory process in the noncontrast abdomen or pelvis. 2.  Aortic Atherosclerosis (ICD10-I70.0). Electronically Signed   By: [place] Fleming M.D.   On: 09/09/2023 05:28   DG Chest  Port 1 View  Result Date: 09/09/2023 CLINICAL DATA:  52 year old male with abdominal pain, nausea vomiting for 3 days. EXAM: PORTABLE CHEST 1 VIEW COMPARISON:  Chest radiographs 11/21/2022 and earlier. FINDINGS: Portable AP upright view at 0504 hours. Lung volumes and mediastinal contours are stable and within normal limits. Allowing for portable technique the lungs are clear. Visualized tracheal air column is within normal limits. Chronic right clavicle fracture. No acute osseous abnormality identified. Paucity of visible bowel gas in the abdomen. IMPRESSION: Negative portable chest. Electronically Signed   By: [place] Fleming M.D.   On: 09/09/2023 05:24      Impression / Plan:   Tyler Bennett is a 52 y.o. y/o male admitted with a 4 day history of acute nausea and vomiting which have resolved today . Likely results of a stomach bug causing acute symptoms resolving in 3-4 days , CT abdomen normal and labs are normal presently although he was dehydrated on admission . Prior THC exposure- urine test pending - if THC seen could also explain his presentation. Since he has no symptoms presently do not suggest any inpatient GI work up , if recurs then will need further  eval .   I will sign off.  Please call me if any further GI concerns or questions.  We would like to thank you for the opportunity to participate in the care of Tyler Bennett.   Thank you for involving me in the care of this patient.      LOS: 1 day   Wyline Mood, MD  09/10/2023, 12:21 PM

## 2023-09-10 NOTE — ED Notes (Addendum)
ED TO INPATIENT HANDOFF REPORT  ED Nurse Name and Phone #: Victorino Dike [phone]  R Name/Age/Gender Catarina Hartshorn 52 y.o. male Room/Bed: ED32A/ED32A  Code Status   Code Status: Full Code  Home/SNF/Other Home Patient oriented to: self, place, time, and situation Is this baseline? Yes   Triage Complete: Triage complete  Chief Complaint Vomiting [R11.10]  Triage Note Pt c/o abd pain n/v x3days. Pt denies cough congestion or fevers.    Allergies No Known Allergies  Level of Care/Admitting Diagnosis ED Disposition     ED Disposition  Admit   Condition  --   Comment  Hospital Area: [facility] [[id number]]  Level of Care: Med-Surg [16]  Covid Evaluation: Asymptomatic - no recent exposure (last 10 days) testing not required  Diagnosis: Vomiting [207392]  Admitting Physician: Andris Baumann [[id number]]  Attending Physician: Andris Baumann [[id number]]  Certification:: I certify this patient will need inpatient services for at least 2 midnights  Expected Medical Readiness: 09/11/2023          B Medical/Surgery History History reviewed. No pertinent past medical history. Past Surgical History:  Procedure Laterality Date   CARDIAC CATHETERIZATION N/A 05/24/2015   Procedure: Left Heart Cath and Coronary Angiography;  Surgeon: Tonny Bollman, MD;  Location: [facility] INVASIVE CV LAB;  Service: Cardiovascular;  Laterality: N/A;     A IV Location/Drains/Wounds Patient Lines/Drains/Airways Status     Active Line/Drains/Airways     Name Placement date Placement time Site Days   Peripheral IV 09/09/23 18 G Left Antecubital 09/09/23  0420  Antecubital  1            Intake/Output Last 24 hours  Intake/Output Summary (Last 24 hours) at 09/10/2023 0323 Last data filed at 09/10/2023 0215 Gross per 24 hour  Intake 1050 ml  Output 1000 ml  Net 50 ml    Labs/Imaging Results for orders placed or performed during the hospital encounter of 09/09/23 (from the  past 48 hour(s))  Lipase, blood     Status: None   Collection Time: 09/09/23  2:35 AM  Result Value Ref Range   Lipase 29 11 - 51 U/L    Comment: Performed at [facility], [address] [place], [place] [address]  Comprehensive metabolic panel     Status: Abnormal   Collection Time: 09/09/23  2:35 AM  Result Value Ref Range   Sodium 135 135 - 145 mmol/L   Potassium 3.9 3.5 - 5.1 mmol/L   Chloride 99 98 - 111 mmol/L   CO2 17 (L) 22 - 32 mmol/L   Glucose, Bld 159 (H) 70 - 99 mg/dL    Comment: Glucose reference range applies only to samples taken after fasting for at least 8 hours.   BUN 36 (H) 6 - 20 mg/dL   Creatinine, Ser 2.13 (H) 0.61 - 1.24 mg/dL   Calcium 08.6 (H) 8.9 - 10.3 mg/dL   Total Protein 9.4 (H) 6.5 - 8.1 g/dL   Albumin 5.6 (H) 3.5 - 5.0 g/dL   AST 38 15 - 41 U/L   ALT 24 0 - 44 U/L   Alkaline Phosphatase 106 38 - 126 U/L   Total Bilirubin 1.7 (H) 0.3 - 1.2 mg/dL   GFR, Estimated 37 (L) >60 mL/min    Comment: (NOTE) Calculated using the CKD-EPI Creatinine Equation (2021)    Anion gap 19 (H) 5 - 15    Comment: Performed at [facility], [address], [place], [place] [address]  CBC     Status: Abnormal   Collection Time: 09/09/23  2:35 AM  Result Value Ref Range   WBC 21.5 (H) 4.0 - 10.5 K/uL   RBC 5.80 4.22 - 5.81 MIL/uL   Hemoglobin 18.3 (H) 13.0 - 17.0 g/dL   HCT 74.2 59.5 - 63.8 %   MCV 88.4 80.0 - 100.0 fL   MCH 31.6 26.0 - 34.0 pg   MCHC 35.7 30.0 - 36.0 g/dL   RDW 75.6 43.3 - 29.5 %   Platelets 331 150 - 400 K/uL   nRBC 0.0 0.0 - 0.2 %    Comment: Performed at [facility], [address] [place], [place] [address]  CBC with Differential/Platelet     Status: Abnormal   Collection Time: 09/09/23  2:35 AM  Result Value Ref Range   WBC 21.2 (H) 4.0 - 10.5 K/uL   RBC 5.87 (H) 4.22 - 5.81 MIL/uL   Hemoglobin 18.4 (H) 13.0 - 17.0 g/dL   HCT 66.0 (H) 63.0 - 16.0 %   MCV 90.1 80.0 - 100.0 fL   MCH 31.3 26.0 - 34.0 pg    MCHC 34.8 30.0 - 36.0 g/dL   RDW 10.9 32.3 - 55.7 %   Platelets 336 150 - 400 K/uL   nRBC 0.0 0.0 - 0.2 %   Neutrophils Relative % 74 %   Neutro Abs 15.7 (H) 1.7 - 7.7 K/uL   Lymphocytes Relative 16 %   Lymphs Abs 3.3 0.7 - 4.0 K/uL   Monocytes Relative 9 %   Monocytes Absolute 1.9 (H) 0.1 - 1.0 K/uL   Eosinophils Relative 0 %   Eosinophils Absolute 0.0 0.0 - 0.5 K/uL   Basophils Relative 0 %   Basophils Absolute 0.1 0.0 - 0.1 K/uL   Immature Granulocytes 1 %   Abs Immature Granulocytes 0.14 (H) 0.00 - 0.07 K/uL    Comment: Performed at [facility], [address] [place], [place] [address]  CBG monitoring, ED     Status: Abnormal   Collection Time: 09/09/23 11:23 AM  Result Value Ref Range   Glucose-Capillary 114 (H) 70 - 99 mg/dL    Comment: Glucose reference range applies only to samples taken after fasting for at least 8 hours.  Lactic acid, plasma     Status: None   Collection Time: 09/09/23 11:27 AM  Result Value Ref Range   Lactic Acid, Venous 1.4 0.5 - 1.9 mmol/L    Comment: Performed at [facility], [address] [place], [place] [address]  Blood gas, venous     Status: Abnormal   Collection Time: 09/09/23 11:27 AM  Result Value Ref Range   pH, Ven 7.46 (H) 7.25 - 7.43   pCO2, Ven 35 (L) 44 - 60 mmHg   pO2, Ven 92 (H) 32 - 45 mmHg   Bicarbonate 24.9 20.0 - 28.0 mmol/L   Acid-Base Excess 1.4 0.0 - 2.0 mmol/L   O2 Saturation 98.2 %   Patient temperature 37.0    Collection site VEIN     Comment: Performed at [facility], [address] [place], [place] [address]  Hemoglobin A1c     Status: Abnormal   Collection Time: 09/09/23 11:27 AM  Result Value Ref Range   Hgb A1c MFr Bld 5.7 (H) 4.8 - 5.6 %    Comment: (NOTE) Pre diabetes:          5.7%-6.4%  Diabetes:              >  6.4%  Glycemic control for   <7.0% adults with diabetes    Mean Plasma Glucose 116.89 mg/dL    Comment: Performed at [facility], [address] [place], [place] [address]  Urinalysis, Routine w reflex microscopic -Urine, Clean Catch     Status: Abnormal   Collection Time: 09/09/23 11:31 AM  Result Value Ref Range   Color, Urine YELLOW (A) YELLOW   APPearance HAZY (A) CLEAR   Specific Gravity, Urine 1.021 1.005 - 1.030   pH 5.0 5.0 - 8.0   Glucose, UA NEGATIVE NEGATIVE mg/dL   Hgb urine dipstick SMALL (A) NEGATIVE   Bilirubin Urine NEGATIVE NEGATIVE   Ketones, ur NEGATIVE NEGATIVE mg/dL   Protein, ur 30 (A) NEGATIVE mg/dL   Nitrite NEGATIVE NEGATIVE   Leukocytes,Ua NEGATIVE NEGATIVE   RBC / HPF 0-5 0 - 5 RBC/hpf   WBC, UA 0-5 0 - 5 WBC/hpf   Bacteria, UA NONE SEEN NONE SEEN   Squamous Epithelial / HPF 0-5 0 - 5 /HPF   Mucus PRESENT    Hyaline Casts, UA PRESENT    Uric Acid Crys, UA PRESENT     Comment: Performed at [facility], [address] [place], [place] [address]  Sodium, urine, random     Status: None   Collection Time: 09/09/23 11:31 AM  Result Value Ref Range   Sodium, Ur 12 mmol/L    Comment: Performed at [facility], [address] [place], [place] [address]  Creatinine, urine, random     Status: None   Collection Time: 09/09/23 11:31 AM  Result Value Ref Range   Creatinine, Urine 212 mg/dL    Comment: Performed at [facility], [address] [place], [place] [address]  Lactic acid, plasma     Status: Abnormal   Collection Time: 09/09/23  3:45 PM  Result Value Ref Range   Lactic Acid, Venous 2.1 (HH) 0.5 - 1.9 mmol/L    Comment: CRITICAL RESULT CALLED TO, READ BACK BY AND VERIFIED WITH HEATHER FISHER AT 1626 ON 09/09/23 BY SS Performed at [facility], [address] [place], [place] [address]   CBG monitoring, ED     Status: Abnormal   Collection Time: 09/09/23  4:28 PM  Result Value Ref Range   Glucose-Capillary 141 (H) 70 - 99 mg/dL    Comment: Glucose reference range applies only to samples taken after fasting for at least 8 hours.  Lactic acid, plasma      Status: None   Collection Time: 09/09/23  5:54 PM  Result Value Ref Range   Lactic Acid, Venous 1.9 0.5 - 1.9 mmol/L    Comment: Performed at [facility], [address] [place], [place] [address]  CBG monitoring, ED     Status: Abnormal   Collection Time: 09/09/23  8:18 PM  Result Value Ref Range   Glucose-Capillary 111 (H) 70 - 99 mg/dL    Comment: Glucose reference range applies only to samples taken after fasting for at least 8 hours.  Lactic acid, plasma     Status: None   Collection Time: 09/09/23  8:57 PM  Result Value Ref Range   Lactic Acid, Venous 1.3 0.5 - 1.9 mmol/L    Comment: Performed at [facility], [address] [place], [place] [address]  CBG monitoring, ED     Status: Abnormal   Collection Time: 09/09/23 11:16 PM  Result Value Ref Range   Glucose-Capillary 114 (H) 70 -  99 mg/dL    Comment: Glucose reference range applies only to samples taken after fasting for at least 8 hours.   CT ABDOMEN PELVIS WO CONTRAST  Result Date: 09/09/2023 CLINICAL DATA:  52 year old male with abdominal pain, nausea vomiting for 3 days. EXAM: CT ABDOMEN AND PELVIS WITHOUT CONTRAST TECHNIQUE: Multidetector CT imaging of the abdomen and pelvis was performed following the standard protocol without IV contrast. RADIATION DOSE REDUCTION: This exam was performed according to the departmental dose-optimization program which includes automated exposure control, adjustment of the mA and/or kV according to patient size and/or use of iterative reconstruction technique. COMPARISON:  CT Abdomen and Pelvis 11/21/2022 and earlier. FINDINGS: Lower chest: Mildly lower lung volumes, but mild dependent right lung base atelectasis or scarring has not changed. No pericardial or pleural effusion. Hepatobiliary: Negative noncontrast liver and gallbladder. Pancreas: Negative. Spleen: Negative. Adrenals/Urinary Tract: Adrenal glands remain normal. Nonobstructed kidneys. Punctate renal vascular  calcifications suspected. No convincing nephrolithiasis. No evidence of renal inflammation. Ureters are normal. Diminutive and negative bladder. Occasional pelvic phleboliths. Stomach/Bowel: Decompressed large bowel from the mid transverse colon distally. Occasional diverticula in the descending and sigmoid segments without active inflammation. Mildly redundant hepatic flexure and right colon are negative. Diminutive and normal appendix containing some residual contrast or other dense enteric material (coronal image thirty-eight). Decompressed and negative terminal ileum. Nondilated small bowel and stomach. No free air, free fluid, mesenteric inflammation. Vascular/Lymphatic: Aortoiliac calcified atherosclerosis. Mildly tortuous infrarenal abdominal aorta. Vascular patency is not evaluated in the absence of IV contrast. No lymphadenopathy. Reproductive: Negative. Other: No pelvis free fluid. Musculoskeletal: Chronic or congenital left L5 pars fracture. No spondylolisthesis. No acute osseous abnormality identified. IMPRESSION: 1. No acute or inflammatory process in the noncontrast abdomen or pelvis. 2.  Aortic Atherosclerosis (ICD10-I70.0). Electronically Signed   By: [place] Fleming M.D.   On: 09/09/2023 05:28   DG Chest Port 1 View  Result Date: 09/09/2023 CLINICAL DATA:  52 year old male with abdominal pain, nausea vomiting for 3 days. EXAM: PORTABLE CHEST 1 VIEW COMPARISON:  Chest radiographs 11/21/2022 and earlier. FINDINGS: Portable AP upright view at 0504 hours. Lung volumes and mediastinal contours are stable and within normal limits. Allowing for portable technique the lungs are clear. Visualized tracheal air column is within normal limits. Chronic right clavicle fracture. No acute osseous abnormality identified. Paucity of visible bowel gas in the abdomen. IMPRESSION: Negative portable chest. Electronically Signed   By: [place] Fleming M.D.   On: 09/09/2023 05:24    Pending Labs Unresulted Labs (From admission,  onward)     Start     Ordered   09/10/23 0500  CBC  Tomorrow morning,   R        09/09/23 1111   09/10/23 0500  Comprehensive metabolic panel  Tomorrow morning,   R        09/09/23 1111   09/09/23 1214  Culture, blood (Routine X 2) w Reflex to ID Panel  BLOOD CULTURE X 2,   TIMED      09/09/23 1213            Vitals/Pain Today's Vitals   09/10/23 0100 09/10/23 0130 09/10/23 0230 09/10/23 0300  BP: 104/70 111/75 102/66 (!) 153/96  Pulse: (!) 50 (!) 52 79 84  Resp: 15 16 17    Temp:      TempSrc:      SpO2: 94% 94% 99% 99%  Weight:      Height:      PainSc:  Isolation Precautions No active isolations  Medications Medications  morphine (PF) 2 MG/ML injection 2 mg (has no administration in time range)  enoxaparin (LOVENOX) injection 40 mg (40 mg Subcutaneous Given 09/09/23 2100)  0.9 %  sodium chloride infusion ( Intravenous New Bag/Given 09/10/23 0002)  ondansetron (ZOFRAN) tablet 4 mg ( Oral See Alternative 09/10/23 0212)    Or  ondansetron (ZOFRAN) injection 4 mg (4 mg Intravenous Given 09/10/23 0212)  nicotine (NICODERM CQ - dosed in mg/24 hours) patch 14 mg (14 mg Transdermal Patient Refused/Not Given 09/09/23 1126)  insulin aspart (novoLOG) injection 0-9 Units ( Subcutaneous Not Given 09/09/23 2317)  pantoprazole (PROTONIX) injection 40 mg (40 mg Intravenous Given 09/09/23 1750)  ondansetron (ZOFRAN-ODT) disintegrating tablet 4 mg (4 mg Oral Given 09/09/23 0236)  sodium chloride 0.9 % bolus 1,000 mL (0 mLs Intravenous Stopped 09/09/23 0647)  morphine (PF) 4 MG/ML injection 4 mg (4 mg Intravenous Given 09/09/23 0426)  ondansetron (ZOFRAN) injection 4 mg (4 mg Intravenous Given 09/09/23 0425)  piperacillin-tazobactam (ZOSYN) IVPB 3.375 g (0 g Intravenous Stopped 09/09/23 0647)  morphine (PF) 4 MG/ML injection 4 mg (4 mg Intravenous Given 09/09/23 0837)  metoCLOPramide (REGLAN) injection 5 mg (5 mg Intravenous Given 09/09/23 0647)  promethazine (PHENERGAN) 12.5  mg in sodium chloride 0.9 % 50 mL IVPB (0 mg Intravenous Stopped 09/09/23 0841)  lactated ringers bolus 500 mL (0 mLs Intravenous Stopped 09/09/23 1740)    Mobility walks     Focused Assessments GI -  pt having episodes of nausea/ vomiting and consistent abdominal pain. Medicated with some relief.    R Recommendations: See Admitting Provider Note  Report given to:   Additional Notes: none

## 2023-09-10 NOTE — Plan of Care (Signed)

## 2023-09-10 NOTE — Progress Notes (Signed)
Progress Note    Tyler Bennett  WUJ:[id number] DOB: Aug 16, 1971  DOA: 09/09/2023 PCP: Pcp, No      Brief Narrative:    Medical records reviewed and are as summarized below:  Tyler Bennett is a 52 y.o. male with medical history significant of tobacco abuse, recurrent nausea and vomiting, hyperglycemia, who presented to the hospital with intractable nausea, vomiting and abdominal pain.  He says symptoms have been going on for about 3 days.  He also reported previous episodes of nausea, vomiting abdominal pain.  He said the first episode started about 3 or 5 years ago, he was not sure about the exact date.  He reports having the symptoms about once a year.  Chart review shows that he was hospitalized in June 2015 for possible acute gastroenteritis and in July 2016 for chest pain and intractable nausea and vomiting.  He has not had any endoscopic workup.      Assessment/Plan:   Principal Problem:   Vomiting Active Problems:   Nausea and vomiting   AKI (acute kidney injury) (HCC)   Smoker   Hyperglycemia   Leukocytosis    Body mass index is 29.65 kg/m.   Recurrent nausea, vomiting and abdominal pain: No acute abnormality noted on CT abdomen pelvis.  No previous endoscopic workup.   Restart IV fluids because of poor oral intake. Continue antiemetics and analgesics as needed.  Consulted gastroenterologist, Dr. Tobi Bastos, to assist with management.   AKI: Improved.  Continue to monitor BMP.   Leukocytosis: Improving.  This is probably reactive.   Hyperglycemia, prediabetes: Hemoglobin A1c was 5.7.  Hemoglobin A1c was 5.9 on 11/22/2022.  Low glucose diet recommended.   Tobacco use disorder: Counseled to quit smoking cigarettes.   Chart review shows urine drug screen was positive for tetrahydrocannabinol in January 2024 in July 2016.  Obtain urine drug screen.   Plan of care was discussed with Marchelle Folks, girlfriend, at the bedside. Plan of care discussed with [place],  RN, at the bedside    Diet Order             Diet heart healthy/carb modified Room service appropriate? Yes; Fluid consistency: Thin  Diet effective now                            Consultants: Gastroenterologist  Procedures: None    Medications:    enoxaparin (LOVENOX) injection  40 mg Subcutaneous Q24H   insulin aspart  0-9 Units Subcutaneous Q4H   nicotine  14 mg Transdermal Daily   pantoprazole (PROTONIX) IV  40 mg Intravenous Q12H   Continuous Infusions:  sodium chloride 100 mL/hr at 09/10/23 0002     Anti-infectives (From admission, onward)    Start     Dose/Rate Route Frequency Ordered Stop   09/09/23 0500  piperacillin-tazobactam (ZOSYN) IVPB 3.375 g        3.375 g 100 mL/hr over 30 Minutes Intravenous  Once 09/09/23 0447 09/09/23 0647              Family Communication/Anticipated D/C date and plan/Code Status   DVT prophylaxis: enoxaparin (LOVENOX) injection 40 mg Start: 09/09/23 2200     Code Status: Full Code  Family Communication: Plan discussed with the mother, girlfriend, the bedside Disposition Plan: Plan to discharge home   Status is: Inpatient Remains inpatient appropriate because: Intractable nausea and vomiting       Subjective:   Interval events noted.  He complains of nausea, vomiting and abdominal pain.  He said even ice chips makes him vomit.  He has a lot of dry heaving.  He has upper abdominal pain that is rated as 6/10 in severity.  Pain is described as "a knot" in the belly.  Marchelle Folks, girlfriend, was at bedside. [place], RN, at bedside  Objective:    Vitals:   09/10/23 0230 09/10/23 0300 09/10/23 0426 09/10/23 0822  BP: 102/66 (!) 153/96 (!) 155/83 (!) 155/85  Pulse: 79 84 66 (!) 59  Resp: 17  18 17   Temp:   97.8 F (36.6 C) 98.1 F (36.7 C)  TempSrc:      SpO2: 99% 99% 99% 98%  Weight:      Height:       No data found.   Intake/Output Summary (Last 24 hours) at 09/10/2023 1031 Last data  filed at 09/10/2023 0215 Gross per 24 hour  Intake --  Output 1000 ml  Net -1000 ml   Filed Weights   09/09/23 0233  Weight: 88.5 kg    Exam:  GEN: NAD SKIN: No rash EYES: No pallor or icterus ENT: MMM CV: RRR PULM: CTA B ABD: soft, mild upper mid abdominal tenderness, no rebound tenderness or guarding,, NT, +BS CNS: AAO x 3, non focal EXT: No edema or tenderness        Data Reviewed:   I have personally reviewed following labs and imaging studies:  Labs: Labs show the following:   Basic Metabolic Panel: Recent Labs  Lab 09/09/23 0235 09/10/23 0601  NA 135 138  K 3.9 3.6  CL 99 106  CO2 17* 21*  GLUCOSE 159* 117*  BUN 36* 26*  CREATININE 2.12* 1.18  CALCIUM 10.6* 8.7*   GFR Estimated Creatinine Clearance: 78.2 mL/min (by C-G formula based on SCr of 1.18 mg/dL). Liver Function Tests: Recent Labs  Lab 09/09/23 0235 09/10/23 0601  AST 38 30  ALT 24 22  ALKPHOS 106 74  BILITOT 1.7* 1.4*  PROT 9.4* 7.0  ALBUMIN 5.6* 4.1   Recent Labs  Lab 09/09/23 0235  LIPASE 29   No results for input(s): "AMMONIA" in the last 168 hours. Coagulation profile No results for input(s): "INR", "PROTIME" in the last 168 hours.  CBC: Recent Labs  Lab 09/09/23 0235 09/10/23 0601  WBC 21.2*  21.5* 12.9*  NEUTROABS 15.7*  --   HGB 18.4*  18.3* 15.9  HCT 52.9*  51.3 44.3  MCV 90.1  88.4 90.2  PLT 336  331 252   Cardiac Enzymes: No results for input(s): "CKTOTAL", "CKMB", "CKMBINDEX", "TROPONINI" in the last 168 hours. BNP (last 3 results) No results for input(s): "PROBNP" in the last 8760 hours. CBG: Recent Labs  Lab 09/09/23 1123 09/09/23 1628 09/09/23 2018 09/09/23 2316 09/10/23 0824  GLUCAP 114* 141* 111* 114* 106*   D-Dimer: No results for input(s): "DDIMER" in the last 72 hours. Hgb A1c: Recent Labs    09/09/23 1127  HGBA1C 5.7*   Lipid Profile: No results for input(s): "CHOL", "HDL", "LDLCALC", "TRIG", "CHOLHDL", "LDLDIRECT" in the  last 72 hours. Thyroid function studies: No results for input(s): "TSH", "T4TOTAL", "T3FREE", "THYROIDAB" in the last 72 hours.  Invalid input(s): "FREET3" Anemia work up: No results for input(s): "VITAMINB12", "FOLATE", "FERRITIN", "TIBC", "IRON", "RETICCTPCT" in the last 72 hours. Sepsis Labs: Recent Labs  Lab 09/09/23 0235 09/09/23 1127 09/09/23 1545 09/09/23 1754 09/09/23 2057 09/10/23 0601  WBC 21.2*  21.5*  --   --   --   --  12.9*  LATICACIDVEN  --  1.4 2.1* 1.9 1.3  --     Microbiology Recent Results (from the past 240 hour(s))  Culture, blood (Routine X 2) w Reflex to ID Panel     Status: None (Preliminary result)   Collection Time: 09/09/23 12:44 PM   Specimen: BLOOD  Result Value Ref Range Status   Specimen Description BLOOD BLOOD RIGHT HAND  Final   Special Requests   Final    BOTTLES DRAWN AEROBIC AND ANAEROBIC Blood Culture results may not be optimal due to an excessive volume of blood received in culture bottles   Culture   Final    NO GROWTH < 24 HOURS Performed at [facility], [address] [place], [place] [address]    Report Status PENDING  Incomplete  Culture, blood (Routine X 2) w Reflex to ID Panel     Status: None (Preliminary result)   Collection Time: 09/09/23 12:44 PM   Specimen: BLOOD  Result Value Ref Range Status   Specimen Description BLOOD LEFT ANTECUBITAL  Final   Special Requests   Final    BOTTLES DRAWN AEROBIC AND ANAEROBIC Blood Culture adequate volume   Culture   Final    NO GROWTH < 24 HOURS Performed at [facility], [address], [place], [place] [address]    Report Status PENDING  Incomplete    Procedures and diagnostic studies:  CT ABDOMEN PELVIS WO CONTRAST  Result Date: 09/09/2023 CLINICAL DATA:  52 year old male with abdominal pain, nausea vomiting for 3 days. EXAM: CT ABDOMEN AND PELVIS WITHOUT CONTRAST TECHNIQUE: Multidetector CT imaging of the abdomen and pelvis was performed following the  standard protocol without IV contrast. RADIATION DOSE REDUCTION: This exam was performed according to the departmental dose-optimization program which includes automated exposure control, adjustment of the mA and/or kV according to patient size and/or use of iterative reconstruction technique. COMPARISON:  CT Abdomen and Pelvis 11/21/2022 and earlier. FINDINGS: Lower chest: Mildly lower lung volumes, but mild dependent right lung base atelectasis or scarring has not changed. No pericardial or pleural effusion. Hepatobiliary: Negative noncontrast liver and gallbladder. Pancreas: Negative. Spleen: Negative. Adrenals/Urinary Tract: Adrenal glands remain normal. Nonobstructed kidneys. Punctate renal vascular calcifications suspected. No convincing nephrolithiasis. No evidence of renal inflammation. Ureters are normal. Diminutive and negative bladder. Occasional pelvic phleboliths. Stomach/Bowel: Decompressed large bowel from the mid transverse colon distally. Occasional diverticula in the descending and sigmoid segments without active inflammation. Mildly redundant hepatic flexure and right colon are negative. Diminutive and normal appendix containing some residual contrast or other dense enteric material (coronal image thirty-eight). Decompressed and negative terminal ileum. Nondilated small bowel and stomach. No free air, free fluid, mesenteric inflammation. Vascular/Lymphatic: Aortoiliac calcified atherosclerosis. Mildly tortuous infrarenal abdominal aorta. Vascular patency is not evaluated in the absence of IV contrast. No lymphadenopathy. Reproductive: Negative. Other: No pelvis free fluid. Musculoskeletal: Chronic or congenital left L5 pars fracture. No spondylolisthesis. No acute osseous abnormality identified. IMPRESSION: 1. No acute or inflammatory process in the noncontrast abdomen or pelvis. 2.  Aortic Atherosclerosis (ICD10-I70.0). Electronically Signed   By: Odessa Fleming M.D.   On: 09/09/2023 05:28   DG Chest  Port 1 View  Result Date: 09/09/2023 CLINICAL DATA:  52 year old male with abdominal pain, nausea vomiting for 3 days. EXAM: PORTABLE CHEST 1 VIEW COMPARISON:  Chest radiographs 11/21/2022 and earlier. FINDINGS: Portable AP upright view at 0504 hours. Lung volumes and mediastinal contours are stable and within normal limits. Allowing for portable technique the lungs  are clear. Visualized tracheal air column is within normal limits. Chronic right clavicle fracture. No acute osseous abnormality identified. Paucity of visible bowel gas in the abdomen. IMPRESSION: Negative portable chest. Electronically Signed   By: Odessa Fleming M.D.   On: 09/09/2023 05:24               LOS: 1 day   Porschia Willbanks  [facility]   Pager on [email or web address]. If 7PM-7AM, please contact night-coverage at [email or web address]     09/10/2023, 10:31 AM

## 2023-09-10 NOTE — Plan of Care (Signed)

## 2023-09-10 NOTE — Plan of Care (Signed)

## 2023-09-11 DIAGNOSIS — R112 Nausea with vomiting, unspecified: Secondary | ICD-10-CM | POA: Diagnosis not present

## 2023-09-11 LAB — GLUCOSE, CAPILLARY
Glucose-Capillary: 100 mg/dL — ABNORMAL HIGH (ref 70–99)
Glucose-Capillary: 137 mg/dL — ABNORMAL HIGH (ref 70–99)

## 2023-09-11 MED ORDER — TRAZODONE HCL 50 MG PO TABS
25.0000 mg | ORAL_TABLET | Freq: Once | ORAL | Status: AC
  Administered 2023-09-11: 25 mg via ORAL
  Filled 2023-09-11: qty 1

## 2023-09-11 MED ORDER — ONDANSETRON HCL 4 MG PO TABS: 4.0000 mg | ORAL_TABLET | Freq: Three times a day (TID) | ORAL | 0 refills | Status: DC | PRN

## 2023-09-11 NOTE — TOC Transition Note (Signed)
Transition of Care [facility]) - CM/SW Discharge Note   Patient Details  Name: Tyler Bennett MRN: [id number] Date of Birth: 11-25-1970  Transition of Care [facility]) CM/SW Contact:  Bing Quarry, RN Phone Number: 09/11/2023, 10:29 AM   Clinical Narrative: 09/11/23: Sherrilyn Rist 09/09/23 for abdominal pain, n/v, for 3 days with no report of cough or fever per ED triage notes. Patient has DC orders and to follow up with PCO for routine health management. No PCP listed though patient has insurance via UHC/UMR/PPO. PCP options list added to AVS Summary. No SDOH flags identified.   Gabriel Cirri MSN RN CM  Care Management Department.    [facility] Direct Dial: [phone] Main Office Phone: [phone] Weekends Only            Patient Goals and CMS Choice      Discharge Placement                         Discharge Plan and Services Additional resources added to the After Visit Summary for                                       Social Determinants of Health (SDOH) Interventions SDOH Screenings   Food Insecurity: No Food Insecurity (09/09/2023)  Housing: Low Risk  (09/09/2023)  Transportation Needs: No Transportation Needs (09/09/2023)  Utilities: Not At Risk (09/09/2023)  Tobacco Use: High Risk (09/09/2023)     Readmission Risk Interventions     No data to display

## 2023-09-11 NOTE — Discharge Instructions (Signed)
Some PCP Options in [place] area- not a comprehensive list or specifically endorsed by [facility].   [phone] [facility]- [phone] [facility]- [phone] [facility]- [phone] [facility]- [phone] [facility]- [phone]  or [facility] Physician Referral Line [phone]

## 2023-09-11 NOTE — Discharge Summary (Signed)
Physician Discharge Summary   Patient: Tyler Bennett MRN: [id number] DOB: 02/04/1971  Admit date:     09/09/2023  Discharge date: 09/11/23  Discharge Physician: Lurene Shadow   PCP: Pcp, No   Recommendations at discharge:   Establish care with primary care physician for routine health maintenance  Discharge Diagnoses: Principal Problem:   Vomiting Active Problems:   Nausea and vomiting   AKI (acute kidney injury) (HCC)   Smoker   Hyperglycemia   Leukocytosis  Resolved Problems:   * No resolved hospital problems. *  Hospital Course:  Tyler Bennett is a 52 y.o. male with medical history significant of tobacco abuse, recurrent nausea and vomiting, hyperglycemia, who presented to the hospital with intractable nausea, vomiting and abdominal pain.  He says symptoms have been going on for about 3 days.  He also reported previous episodes of nausea, vomiting abdominal pain.  He said the first episode started about 3 or 5 years ago, he was not sure about the exact date.  He reports having the symptoms about once a year.  Chart review shows that he was hospitalized in June 2015 for possible acute gastroenteritis and in July 2016 for chest pain and intractable nausea and vomiting.  He has not had any endoscopic workup.     Assessment and Plan:  Recurrent nausea, vomiting and abdominal pain: Patient said his symptoms have resolved.  He feels much better. No acute abnormality noted on CT abdomen pelvis.  No previous endoscopic workup.  Gastroenterologist has signed off.   AKI: Improved.       Leukocytosis: Improved this is probably reactive.     Hyperglycemia, prediabetes: Hemoglobin A1c was 5.7.  Hemoglobin A1c was 5.9 on 11/22/2022.  Low glucose diet recommended.     Tobacco use disorder: Counseled to quit smoking cigarettes.     Chart review shows urine drug screen was positive for tetrahydrocannabinol in January 2024, July 2023 and in July 2016.  Repeat urine drug screen  was not collected.     His condition has improved.  He feels better and wants to be discharged home today.  He is deemed stable for discharge.  He requested a work note which was provided.  He also requested Zofran      Consultants: Gastroenterologist Procedures performed: None Disposition: Home Diet recommendation:  Discharge Diet Orders (From admission, onward)     Start     Ordered   09/11/23 0000  Diet - low sodium heart healthy        09/11/23 0953           Cardiac diet DISCHARGE MEDICATION: Allergies as of 09/11/2023   No Known Allergies      Medication List     TAKE these medications    ondansetron 4 MG tablet Commonly known as: Zofran Take 1 tablet (4 mg total) by mouth every 8 (eight) hours as needed for nausea or vomiting.   TYLENOL PO Take 2 tablets by mouth daily as needed (pain,fever, headache).        Discharge Exam: Filed Weights   09/09/23 0233  Weight: 88.5 kg   GEN: NAD SKIN: No rash EYES: No pallor or icterus ENT: MMM CV: RRR PULM: CTA B ABD: soft, ND, NT, +BS CNS: AAO x 3, non focal EXT: No edema or tenderness   Condition at discharge: good  The results of significant diagnostics from this hospitalization (including imaging, microbiology, ancillary and laboratory) are listed below for reference.   Imaging Studies: CT  ABDOMEN PELVIS WO CONTRAST  Result Date: 09/09/2023 CLINICAL DATA:  52 year old male with abdominal pain, nausea vomiting for 3 days. EXAM: CT ABDOMEN AND PELVIS WITHOUT CONTRAST TECHNIQUE: Multidetector CT imaging of the abdomen and pelvis was performed following the standard protocol without IV contrast. RADIATION DOSE REDUCTION: This exam was performed according to the departmental dose-optimization program which includes automated exposure control, adjustment of the mA and/or kV according to patient size and/or use of iterative reconstruction technique. COMPARISON:  CT Abdomen and Pelvis 11/21/2022 and  earlier. FINDINGS: Lower chest: Mildly lower lung volumes, but mild dependent right lung base atelectasis or scarring has not changed. No pericardial or pleural effusion. Hepatobiliary: Negative noncontrast liver and gallbladder. Pancreas: Negative. Spleen: Negative. Adrenals/Urinary Tract: Adrenal glands remain normal. Nonobstructed kidneys. Punctate renal vascular calcifications suspected. No convincing nephrolithiasis. No evidence of renal inflammation. Ureters are normal. Diminutive and negative bladder. Occasional pelvic phleboliths. Stomach/Bowel: Decompressed large bowel from the mid transverse colon distally. Occasional diverticula in the descending and sigmoid segments without active inflammation. Mildly redundant hepatic flexure and right colon are negative. Diminutive and normal appendix containing some residual contrast or other dense enteric material (coronal image thirty-eight). Decompressed and negative terminal ileum. Nondilated small bowel and stomach. No free air, free fluid, mesenteric inflammation. Vascular/Lymphatic: Aortoiliac calcified atherosclerosis. Mildly tortuous infrarenal abdominal aorta. Vascular patency is not evaluated in the absence of IV contrast. No lymphadenopathy. Reproductive: Negative. Other: No pelvis free fluid. Musculoskeletal: Chronic or congenital left L5 pars fracture. No spondylolisthesis. No acute osseous abnormality identified. IMPRESSION: 1. No acute or inflammatory process in the noncontrast abdomen or pelvis. 2.  Aortic Atherosclerosis (ICD10-I70.0). Electronically Signed   By: Odessa Fleming M.D.   On: 09/09/2023 05:28   DG Chest Port 1 View  Result Date: 09/09/2023 CLINICAL DATA:  52 year old male with abdominal pain, nausea vomiting for 3 days. EXAM: PORTABLE CHEST 1 VIEW COMPARISON:  Chest radiographs 11/21/2022 and earlier. FINDINGS: Portable AP upright view at 0504 hours. Lung volumes and mediastinal contours are stable and within normal limits. Allowing for  portable technique the lungs are clear. Visualized tracheal air column is within normal limits. Chronic right clavicle fracture. No acute osseous abnormality identified. Paucity of visible bowel gas in the abdomen. IMPRESSION: Negative portable chest. Electronically Signed   By: Odessa Fleming M.D.   On: 09/09/2023 05:24    Microbiology: Results for orders placed or performed during the hospital encounter of 09/09/23  Culture, blood (Routine X 2) w Reflex to ID Panel     Status: None (Preliminary result)   Collection Time: 09/09/23 12:44 PM   Specimen: BLOOD  Result Value Ref Range Status   Specimen Description BLOOD BLOOD RIGHT HAND  Final   Special Requests   Final    BOTTLES DRAWN AEROBIC AND ANAEROBIC Blood Culture results may not be optimal due to an excessive volume of blood received in culture bottles   Culture   Final    NO GROWTH 2 DAYS Performed at [facility], [address], [place], [place] [address]    Report Status PENDING  Incomplete  Culture, blood (Routine X 2) w Reflex to ID Panel     Status: None (Preliminary result)   Collection Time: 09/09/23 12:44 PM   Specimen: BLOOD  Result Value Ref Range Status   Specimen Description BLOOD LEFT ANTECUBITAL  Final   Special Requests   Final    BOTTLES DRAWN AEROBIC AND ANAEROBIC Blood Culture adequate volume   Culture   Final  NO GROWTH 2 DAYS Performed at [facility], [address] [place], [place] [address]    Report Status PENDING  Incomplete    Labs: CBC: Recent Labs  Lab 09/09/23 0235 09/10/23 0601  WBC 21.2*  21.5* 12.9*  NEUTROABS 15.7*  --   HGB 18.4*  18.3* 15.9  HCT 52.9*  51.3 44.3  MCV 90.1  88.4 90.2  PLT 336  331 252   Basic Metabolic Panel: Recent Labs  Lab 09/09/23 0235 09/10/23 0601  NA 135 138  K 3.9 3.6  CL 99 106  CO2 17* 21*  GLUCOSE 159* 117*  BUN 36* 26*  CREATININE 2.12* 1.18  CALCIUM 10.6* 8.7*   Liver Function Tests: Recent Labs  Lab 09/09/23 0235  09/10/23 0601  AST 38 30  ALT 24 22  ALKPHOS 106 74  BILITOT 1.7* 1.4*  PROT 9.4* 7.0  ALBUMIN 5.6* 4.1   CBG: Recent Labs  Lab 09/10/23 1644 09/10/23 2011 09/10/23 2352 09/11/23 0400 09/11/23 0914  GLUCAP 94 95 104* 100* 137*    Discharge time spent: greater than 30 minutes.  Signed: Lurene Shadow, MD [facility] 09/11/2023

## 2023-09-13 ENCOUNTER — Other Ambulatory Visit: Payer: Self-pay | Admitting: Internal Medicine

## 2023-09-13 DIAGNOSIS — R112 Nausea with vomiting, unspecified: Secondary | ICD-10-CM

## 2023-09-13 NOTE — Progress Notes (Unsigned)
{  Select_TRH_Note:[id number]} 

## 2023-09-14 LAB — CULTURE, BLOOD (ROUTINE X 2)
Culture: NO GROWTH
Culture: NO GROWTH
Special Requests: ADEQUATE

## 2023-09-15 ENCOUNTER — Emergency Department
Admission: EM | Admit: 2023-09-15 | Discharge: 2023-09-15 | Disposition: A | Discharge status: Home / Self Care | Payer: Commercial Managed Care - PPO | Attending: Emergency Medicine | Admitting: Emergency Medicine

## 2023-09-15 ENCOUNTER — Emergency Department: Payer: Commercial Managed Care - PPO

## 2023-09-15 ENCOUNTER — Other Ambulatory Visit: Payer: Self-pay

## 2023-09-15 DIAGNOSIS — R1013 Epigastric pain: Secondary | ICD-10-CM | POA: Insufficient documentation

## 2023-09-15 DIAGNOSIS — R112 Nausea with vomiting, unspecified: Secondary | ICD-10-CM | POA: Insufficient documentation

## 2023-09-15 DIAGNOSIS — R1012 Left upper quadrant pain: Secondary | ICD-10-CM | POA: Diagnosis not present

## 2023-09-15 LAB — COMPREHENSIVE METABOLIC PANEL
ALT: 21 U/L (ref 0–44)
AST: 23 U/L (ref 15–41)
Albumin: 4.8 g/dL (ref 3.5–5.0)
Alkaline Phosphatase: 95 U/L (ref 38–126)
Anion gap: 13 (ref 5–15)
BUN: 25 mg/dL — ABNORMAL HIGH (ref 6–20)
CO2: 22 mmol/L (ref 22–32)
Calcium: 9.4 mg/dL (ref 8.9–10.3)
Chloride: 97 mmol/L — ABNORMAL LOW (ref 98–111)
Creatinine, Ser: 1.21 mg/dL (ref 0.61–1.24)
GFR, Estimated: >60 mL/min (ref >60)
Glucose, Bld: 161 mg/dL — ABNORMAL HIGH (ref 70–99)
Potassium: 4 mmol/L (ref 3.5–5.1)
Sodium: 132 mmol/L — ABNORMAL LOW (ref 135–145)
Total Bilirubin: 1.5 mg/dL — ABNORMAL HIGH (ref 0.3–1.2)
Total Protein: 8.1 g/dL (ref 6.5–8.1)

## 2023-09-15 LAB — URINALYSIS, ROUTINE W REFLEX MICROSCOPIC
Glucose, UA: NEGATIVE mg/dL
Ketones, ur: NEGATIVE mg/dL
Leukocytes,Ua: NEGATIVE
Nitrite: NEGATIVE
Protein, ur: 30 mg/dL — AB
Specific Gravity, Urine: 1.03 (ref 1.005–1.030)
pH: 5 (ref 5.0–8.0)

## 2023-09-15 LAB — CBC
HCT: 49.8 % (ref 39.0–52.0)
Hemoglobin: 18.1 g/dL — ABNORMAL HIGH (ref 13.0–17.0)
MCH: 32 pg (ref 26.0–34.0)
MCHC: 36.3 g/dL — ABNORMAL HIGH (ref 30.0–36.0)
MCV: 88.1 fL (ref 80.0–100.0)
Platelets: 333 10*3/uL (ref 150–400)
RBC: 5.65 MIL/uL (ref 4.22–5.81)
RDW: 12.4 % (ref 11.5–15.5)
WBC: 14.6 10*3/uL — ABNORMAL HIGH (ref 4.0–10.5)
nRBC: 0 % (ref 0.0–0.2)

## 2023-09-15 LAB — LIPASE, BLOOD: Lipase: 31 U/L (ref 11–51)

## 2023-09-15 MED ORDER — MORPHINE SULFATE (PF) 4 MG/ML IV SOLN
4.0000 mg | Freq: Once | INTRAVENOUS | Status: AC
  Administered 2023-09-15: 4 mg via INTRAVENOUS
  Filled 2023-09-15: qty 1

## 2023-09-15 MED ORDER — ONDANSETRON HCL 4 MG/2ML IJ SOLN
4.0000 mg | Freq: Once | INTRAMUSCULAR | Status: AC
  Administered 2023-09-15: 4 mg via INTRAVENOUS
  Filled 2023-09-15: qty 2

## 2023-09-15 MED ORDER — IOHEXOL 300 MG/ML  SOLN
100.0000 mL | Freq: Once | INTRAMUSCULAR | Status: AC | PRN
  Administered 2023-09-15: 100 mL via INTRAVENOUS

## 2023-09-15 MED ORDER — LACTATED RINGERS IV BOLUS
1000.0000 mL | Freq: Once | INTRAVENOUS | Status: AC
  Administered 2023-09-15: 1000 mL via INTRAVENOUS

## 2023-09-15 MED ORDER — PROCHLORPERAZINE EDISYLATE 10 MG/2ML IJ SOLN
5.0000 mg | Freq: Once | INTRAMUSCULAR | Status: AC
  Administered 2023-09-15: 5 mg via INTRAVENOUS
  Filled 2023-09-15: qty 2

## 2023-09-15 NOTE — ED Notes (Signed)
Pt was given glass of water for PO challenge,. Pt was able to keep fluids down without any issues and feels he is ready to go home. MD made aware.

## 2023-09-15 NOTE — ED Notes (Signed)
This RN to bedside to introduce self to pt. Pt resting and in no distress.  Pt advised he is here for belly pain and waiting on CT result.

## 2023-09-15 NOTE — Discharge Instructions (Addendum)
Your workup today was largely reassuring.  Please follow-up with your GI team tomorrow for ongoing evaluation.  Incidentally there was 1 spot noted on your liver.  It is unsure exactly what this is at this time but the radiologist recommendation was at some point in the future for you to get an MRI of your abdomen for further evaluation.  This is likely not related to your symptoms today and is an incidental finding.  You can talk with your primary care provider about scheduling that as a follow-up.

## 2023-09-15 NOTE — ED Triage Notes (Addendum)
Pt arrives via POV. PT c/o ongoing abdominal pain, nausea, and vomiting for over a week. Pt reports losing 22 lbs in that time frame. Pt is  AxOx4. Pt was seen here last week.

## 2023-09-15 NOTE — ED Notes (Signed)
Pt advised he has had GI issues for awhile and has a GI apt tomorrow scheduled.

## 2023-09-15 NOTE — ED Provider Notes (Signed)
  Physical Exam  BP (!) 157/99   Pulse 67   Temp 98 F (36.7 C) (Oral)   Resp 14   Ht 5\' 8"  (1.727 m)   Wt 80.3 kg   SpO2 95%   BMI 26.91 kg/m   Physical Exam I have reviewed the vital signs. General:  Awake, alert, no acute distress. Head:  Normocephalic, Atraumatic. EENT:  PERRL, EOMI, Oral mucosa pink and moist, Neck is supple. Cardiovascular: Regular rate, 2+ distal pulses. Respiratory:  Normal respiratory effort, symmetrical expansion, no distress.   Extremities:  Moving all four extremities through full ROM without pain.   Neuro:  Alert and oriented.  Interacting appropriately.   Skin:  Warm, dry, no rash.   Psych: Appropriate affect.   Procedures  Procedures  ED Course / MDM   Clinical Course as of 09/15/23 1636  Wed Sep 15, 2023  1629 CT ABDOMEN PELVIS W CONTRAST No acute findings.  1.4 cm indeterminate low-attenuation lesion in posterior right hepatic lobe. Recommend nonemergent outpatient abdomen MRI without and with contrast for further characterization. [DW]    Clinical Course User Index [DW] Janith Lima, MD   Medical Decision Making Amount and/or Complexity of Data Reviewed Labs: ordered. Radiology: ordered. Decision-making details documented in ED Course.  Risk Prescription drug management.   Received patient in signout.  52 year old male with no significant past medical history presenting today for epigastric and left upper quadrant abdominal pain.  Difficulty tolerating solid foods for the past 2 weeks.  Laboratory workup performed shows mild leukocytosis at 14.6 but other laboratory workup largely reassuring.  Patient was signed out pending CT abdomen/pelvis.  Concern for most likely gastritis with patient having GI follow-up tomorrow.  CT imaging resulted showing no acute intra-abdominal pathology.  Reassessed patient with complete resolution of pain symptoms.  Did state he had a little bit of nausea earlier and so additional dose of Compazine  was given and p.o. challenged.  Patient was able to tolerate p.o. without issue.  Asking for discharge at this time.  Has GI follow-up tomorrow and was given strict return precautions.     Janith Lima, MD 09/15/23 [phone]

## 2023-09-15 NOTE — Progress Notes (Signed)
Celso Amy, PA-C [address]  [address]  [place], [place] [address]  Main: [phone]  Fax: [phone]   Gastroenterology Consultation  Referring Provider:     No ref. provider found Primary Care Physician:  Pcp, No Primary Gastroenterologist:  Celso Amy, PA-C / Dr. Midge Minium   Reason for Consultation:     Nausea, Vomiting, Abdominal Pain        HPI:   Tyler Bennett is a 52 y.o. y/o male referred for consultation & management  by Pcp, No.    Went to [facility] ED 09/14/33 for LUQ pain, nausea, vomiting, and difficulty tolerating solid food x 2 weeks.  Currently patient states he continues to have LUQ and left side abdominal pain.  He feels constipated.  Has bowel movement every 4 to 5 days.  Had episode of nausea and vomiting last night.  He denies rectal bleeding or family history of colon cancer.  No current treatment for constipation.  Abd / Pelvic CT w/ contrast 09/15/23: No acute findings.1.4 cm indeterminate low-attenuation lesion in posterior right hepatic lobe. Recommend nonemergent outpatient abdomen MRI without and with contrast for further characterization.  Lab 09/15/23: Glucose 161, BUN 25, Cr 1.21, GFR > 60, T. Bili 1.5, All other LFTs normal.  Elevated WBC 14.6, Hgb 18.1.  Normal Lipase.  Medical history significant of tobacco abuse, marijuana use, recurrent nausea and vomiting, hyperglycemia, who was admitted to [facility] hospital 09/09/23 - 09/11/23 with 3-day history of intractable nausea, vomiting and abdominal pain.  Intermittent episodes for approximately 5 years.  Episodes occur approximately once a year. Chart review shows that he was hospitalized in June 2015 for possible acute gastroenteritis and in July 2016 for chest pain and intractable nausea and vomiting.   No previous EGD, colonoscopy, or GI evaluation.  Prediabetic.  HgbA1C 5.9 (Jan. 2024).  No PCP.  Urine drug screens positive for THC 11/2022, 05/2022, and 05/2015.  History reviewed. No  pertinent past medical history.  Past Surgical History:  Procedure Laterality Date   CARDIAC CATHETERIZATION N/A 05/24/2015   Procedure: Left Heart Cath and Coronary Angiography;  Surgeon: Tonny Bollman, MD;  Location: [facility] INVASIVE CV LAB;  Service: Cardiovascular;  Laterality: N/A;    Prior to Admission medications   Medication Sig Start Date End Date Taking? Authorizing Provider  Acetaminophen (TYLENOL PO) Take 2 tablets by mouth daily as needed (pain,fever, headache).    [provider]  omeprazole (PRILOSEC OTC) 20 MG tablet Take by mouth. 09/13/23 09/27/23  [provider]  ondansetron (ZOFRAN) 4 MG tablet Take 1 tablet (4 mg total) by mouth every 8 (eight) hours as needed for nausea or vomiting. 09/11/23   Lurene Shadow, MD    Family History  Problem Relation Age of Onset   Diabetes type II Mother    Heart failure Mother    Hyperlipidemia Father    CAD Other      Social History   Tobacco Use   Smoking status: Former    Types: Cigarettes  Substance Use Topics   Alcohol use: No   Drug use: Not Currently    Types: Marijuana    Allergies as of 09/16/2023   (No Known Allergies)    Review of Systems:    All systems reviewed and negative except where noted in HPI.   Physical Exam:  BP 117/78   Pulse 84   Temp 97.7 F (36.5 C)   Ht 5\' 8"  (1.727 m)   Wt 185 lb 3.2  oz (84 kg)   BMI 28.16 kg/m  No LMP for male patient.  General:   Alert,  Well-developed, well-nourished, pleasant and cooperative in NAD Lungs:  Respirations even and unlabored.  Clear throughout to auscultation.   No wheezes, crackles, or rhonchi. No acute distress. Heart:  Regular rate and rhythm; no murmurs, clicks, rubs, or gallops. Abdomen:  Normal bowel sounds.  No bruits.  Soft, and non-distended without masses, hepatosplenomegaly or hernias noted.  No Tenderness.  No guarding or rebound tenderness.    Neurologic:  Alert and oriented x3;  grossly normal neurologically. Psych:   Alert and cooperative. Normal mood and affect.  Imaging Studies: CT ABDOMEN PELVIS W CONTRAST  Result Date: 09/15/2023 CLINICAL DATA:  Left-sided abdominal pain, nausea, vomiting for several weeks. Unexplained 20 lb weight loss. EXAM: CT ABDOMEN AND PELVIS WITH CONTRAST TECHNIQUE: Multidetector CT imaging of the abdomen and pelvis was performed using the standard protocol following bolus administration of intravenous contrast. RADIATION DOSE REDUCTION: This exam was performed according to the departmental dose-optimization program which includes automated exposure control, adjustment of the mA and/or kV according to patient size and/or use of iterative reconstruction technique. CONTRAST:  OMNIPAQUE IOHEXOL 300 MG/ML  SOLN COMPARISON:  Noncontrast CT on 09/09/2023 FINDINGS: Lower Chest: No acute findings. Hepatobiliary: A poorly defined low-attenuation liver lesion is seen in the posterior right hepatic lobe measuring 1.4 cm on image 23/2. No other liver lesions identified. Gallbladder is unremarkable. No evidence of biliary ductal dilatation. Pancreas:  No mass or inflammatory changes. Spleen: Within normal limits in size and appearance. Adrenals/Urinary Tract: No suspicious masses identified. No evidence of ureteral calculi or hydronephrosis. Stomach/Bowel: No evidence of obstruction, inflammatory process or abnormal fluid collections. Normal appendix visualized. Vascular/Lymphatic: No pathologically enlarged lymph nodes. No acute vascular findings. Reproductive:  No mass or other significant abnormality. Other:  None. Musculoskeletal:  No suspicious bone lesions identified. IMPRESSION: No acute findings. 1.4 cm indeterminate low-attenuation lesion in posterior right hepatic lobe. Recommend nonemergent outpatient abdomen MRI without and with contrast for further characterization. Electronically Signed   By: Danae Orleans M.D.   On: 09/15/2023 16:25   CT ABDOMEN PELVIS WO CONTRAST  Result Date:  09/09/2023 CLINICAL DATA:  52 year old male with abdominal pain, nausea vomiting for 3 days. EXAM: CT ABDOMEN AND PELVIS WITHOUT CONTRAST TECHNIQUE: Multidetector CT imaging of the abdomen and pelvis was performed following the standard protocol without IV contrast. RADIATION DOSE REDUCTION: This exam was performed according to the departmental dose-optimization program which includes automated exposure control, adjustment of the mA and/or kV according to patient size and/or use of iterative reconstruction technique. COMPARISON:  CT Abdomen and Pelvis 11/21/2022 and earlier. FINDINGS: Lower chest: Mildly lower lung volumes, but mild dependent right lung base atelectasis or scarring has not changed. No pericardial or pleural effusion. Hepatobiliary: Negative noncontrast liver and gallbladder. Pancreas: Negative. Spleen: Negative. Adrenals/Urinary Tract: Adrenal glands remain normal. Nonobstructed kidneys. Punctate renal vascular calcifications suspected. No convincing nephrolithiasis. No evidence of renal inflammation. Ureters are normal. Diminutive and negative bladder. Occasional pelvic phleboliths. Stomach/Bowel: Decompressed large bowel from the mid transverse colon distally. Occasional diverticula in the descending and sigmoid segments without active inflammation. Mildly redundant hepatic flexure and right colon are negative. Diminutive and normal appendix containing some residual contrast or other dense enteric material (coronal image thirty-eight). Decompressed and negative terminal ileum. Nondilated small bowel and stomach. No free air, free fluid, mesenteric inflammation. Vascular/Lymphatic: Aortoiliac calcified atherosclerosis. Mildly tortuous infrarenal abdominal aorta. Vascular patency is not  evaluated in the absence of IV contrast. No lymphadenopathy. Reproductive: Negative. Other: No pelvis free fluid. Musculoskeletal: Chronic or congenital left L5 pars fracture. No spondylolisthesis. No acute osseous  abnormality identified. IMPRESSION: 1. No acute or inflammatory process in the noncontrast abdomen or pelvis. 2.  Aortic Atherosclerosis (ICD10-I70.0). Electronically Signed   By: Odessa Fleming M.D.   On: 09/09/2023 05:28   DG Chest Port 1 View  Result Date: 09/09/2023 CLINICAL DATA:  52 year old male with abdominal pain, nausea vomiting for 3 days. EXAM: PORTABLE CHEST 1 VIEW COMPARISON:  Chest radiographs 11/21/2022 and earlier. FINDINGS: Portable AP upright view at 0504 hours. Lung volumes and mediastinal contours are stable and within normal limits. Allowing for portable technique the lungs are clear. Visualized tracheal air column is within normal limits. Chronic right clavicle fracture. No acute osseous abnormality identified. Paucity of visible bowel gas in the abdomen. IMPRESSION: Negative portable chest. Electronically Signed   By: Odessa Fleming M.D.   On: 09/09/2023 05:24    Assessment and Plan:   Tyler Bennett is a 52 y.o. y/o male has been referred for chronic intermittent generalized abdominal pain and episodes of nausea and vomiting.  Differential includes hyperemesis cannabis syndrome, GERD, gastritis, peptic ulcer, H. Pylori, IBS-C.  Scheduling EGD and colonoscopy.  Chronic intermittent nausea and vomiting  Scheduling EGD I discussed risks of EGD with patient to include risk of bleeding, perforation, and risk of sedation.  Patient expressed understanding and agrees to proceed with EGD.   Chronic marijuana use  Encouraged abstinence from marijuana due to risk of hyperemesis cannabis syndrome.  Generalized abdominal pain Reassurance regarding recent abdominal pelvic CT and labs which showed no acute abnormality.   Rx dicyclomine 10 Mg 3 times daily as needed.  Liver lesion  Schedule abdominal MRI with and without contrast  Colon cancer screening   Scheduling Colonoscopy I discussed risks of colonoscopy with patient to include risk of bleeding, colon perforation, and risk of sedation.   Patient expressed understanding and agrees to proceed with colonoscopy.   6.  Constipation  Gave samples of Linzess 72 mcg QD for 1 week, then 145 mcg QD for 1 week.  He will let me know which dose works best, and then he can call me back for a prescription.   Follow up in 3 months.  Celso Amy, PA-C

## 2023-09-15 NOTE — ED Provider Notes (Signed)
[facility] Provider Note    Event Date/Time   First MD Initiated Contact with Patient 09/15/23 1336     (approximate)   History   Chief Complaint Abdominal Pain, Nausea, and Emesis   HPI  Tyler Bennett is a 52 y.o. male with no significant past medical history who presents to the ED complaining of abdominal pain.  Patient reports that he has been having about 2 weeks of increasing pain in his epigastrium and left upper quadrant.  He describes it as a sharp and stabbing pain that is exacerbated when he goes to eat and has been associated with nausea and vomiting.  He states that he has been unable to eat anything solid outside of bananas for the past 2 weeks due to the pain and vomiting.  He denies any associated diarrhea and has not had any difficulty urinating.  He denies any fevers, cough, chest pain, shortness of breath.  He was admitted for similar symptoms last week, denies significant improvement.     Physical Exam   Triage Vital Signs: ED Triage Vitals  Encounter Vitals Group     BP 09/15/23 1258 (!) 133/110     Systolic BP Percentile --      Diastolic BP Percentile --      Pulse Rate 09/15/23 1258 93     Resp 09/15/23 1258 19     Temp 09/15/23 1258 97.8 F (36.6 C)     Temp Source 09/15/23 1258 Oral     SpO2 09/15/23 1258 99 %     Weight 09/15/23 1302 177 lb (80.3 kg)     Height 09/15/23 1302 5\' 8"  (1.727 m)     Head Circumference --      Peak Flow --      Pain Score 09/15/23 1302 7     Pain Loc --      Pain Education --      Exclude from Growth Chart --     Most recent vital signs: Vitals:   09/15/23 1410 09/15/23 1430  BP: (!) 148/109 (!) 154/105  Pulse: (!) 58 73  Resp:    Temp:    SpO2: 94% 97%    Constitutional: Alert and oriented. Eyes: Conjunctivae are normal. Head: Atraumatic. Nose: No congestion/rhinnorhea. Mouth/Throat: Mucous membranes are moist.  Cardiovascular: Normal rate, regular rhythm. Grossly normal  heart sounds.  2+ radial pulses bilaterally. Respiratory: Normal respiratory effort.  No retractions. Lungs CTAB. Gastrointestinal: Soft and tender to palpation in the epigastrium and left upper quadrant with no rebound or guarding.  No distention. Musculoskeletal: No lower extremity tenderness nor edema.  Neurologic:  Normal speech and language. No gross focal neurologic deficits are appreciated.    ED Results / Procedures / Treatments   Labs (all labs ordered are listed, but only abnormal results are displayed) Labs Reviewed  COMPREHENSIVE METABOLIC PANEL - Abnormal; Notable for the following components:      Result Value   Sodium 132 (*)    Chloride 97 (*)    Glucose, Bld 161 (*)    BUN 25 (*)    Total Bilirubin 1.5 (*)    All other components within normal limits  CBC - Abnormal; Notable for the following components:   WBC 14.6 (*)    Hemoglobin 18.1 (*)    MCHC 36.3 (*)    All other components within normal limits  URINALYSIS, ROUTINE W REFLEX MICROSCOPIC - Abnormal; Notable for the following components:   Color, Urine AMBER (*)  APPearance HAZY (*)    Hgb urine dipstick SMALL (*)    Bilirubin Urine SMALL (*)    Protein, ur 30 (*)    Bacteria, UA RARE (*)    All other components within normal limits  LIPASE, BLOOD   RADIOLOGY CT abdomen/pelvis reviewed and interpreted by me with no inflammatory changes, focal fluid collections, or dilated bowel loops.  PROCEDURES:  Critical Care performed: No  Procedures   MEDICATIONS ORDERED IN ED: Medications  morphine (PF) 4 MG/ML injection 4 mg (4 mg Intravenous Given 09/15/23 1421)  ondansetron (ZOFRAN) injection 4 mg (4 mg Intravenous Given 09/15/23 1421)  lactated ringers bolus 1,000 mL (1,000 mLs Intravenous New Bag/Given 09/15/23 1420)  iohexol (OMNIPAQUE) 300 MG/ML solution 100 mL (100 mLs Intravenous Contrast Given 09/15/23 1435)     IMPRESSION / MDM / ASSESSMENT AND PLAN / ED COURSE  I reviewed the triage  vital signs and the nursing notes.                              52 y.o. male with no significant past medical history presents to the ED complaining of increasing epigastric and left upper quadrant abdominal pain with significant nausea and vomiting over the past 2 weeks.  Patient's presentation is most consistent with acute presentation with potential threat to life or bodily function.  Differential diagnosis includes, but is not limited to, gastritis, GERD, pancreatitis, hepatitis, cholecystitis, biliary colic, PUD.  Patient uncomfortable but nontoxic-appearing and in no acute distress, vital signs are unremarkable.  His abdomen is soft but he does have tenderness in his epigastrium and left upper quadrant.  Symptoms seem most consistent with gastritis however given his significant tenderness and leukocytosis, will further assess with CT imaging.  Additional labs are reassuring with no significant anemia, electrolyte abnormality, or AKI.  LFTs and lipase are unremarkable.  We will treat symptomatically with IV morphine and Zofran, hydrate with IV fluids and reassess following imaging.  Patient turned over to oncoming rider pending CT results and reassessment.      FINAL CLINICAL IMPRESSION(S) / ED DIAGNOSES   Final diagnoses:  Left upper quadrant abdominal pain  Nausea and vomiting, unspecified vomiting type     Rx / DC Orders   ED Discharge Orders     None        Note:  This document was prepared using Dragon voice recognition software and may include unintentional dictation errors.   Chesley Noon, MD 09/15/23 [phone]

## 2023-09-16 ENCOUNTER — Encounter: Payer: Self-pay | Admitting: Physician Assistant

## 2023-09-16 ENCOUNTER — Ambulatory Visit (INDEPENDENT_AMBULATORY_CARE_PROVIDER_SITE_OTHER): Payer: Commercial Managed Care - PPO | Admitting: Physician Assistant

## 2023-09-16 VITALS — BP 117/78 | HR 84 | Temp 97.7°F | Ht 68.0 in | Wt 185.2 lb

## 2023-09-16 DIAGNOSIS — R112 Nausea with vomiting, unspecified: Secondary | ICD-10-CM | POA: Diagnosis not present

## 2023-09-16 DIAGNOSIS — K5900 Constipation, unspecified: Secondary | ICD-10-CM

## 2023-09-16 DIAGNOSIS — F1290 Cannabis use, unspecified, uncomplicated: Secondary | ICD-10-CM

## 2023-09-16 DIAGNOSIS — R1084 Generalized abdominal pain: Secondary | ICD-10-CM

## 2023-09-16 DIAGNOSIS — K769 Liver disease, unspecified: Secondary | ICD-10-CM

## 2023-09-16 DIAGNOSIS — Z1211 Encounter for screening for malignant neoplasm of colon: Secondary | ICD-10-CM

## 2023-09-16 MED ORDER — PEG 3350-KCL-NA BICARB-NACL 420 G PO SOLR: 4000.0000 mL | Freq: Once | ORAL | 0 refills | Status: AC

## 2023-09-16 MED ORDER — DICYCLOMINE HCL 10 MG PO CAPS: 10.0000 mg | ORAL_CAPSULE | Freq: Three times a day (TID) | ORAL | 2 refills | Status: DC

## 2023-09-16 NOTE — Patient Instructions (Signed)
MRI scheduled @ [facility] arrive at 11:00 am 09/23/23.Nothing to eat/drink 7 hours prior.

## 2023-09-23 ENCOUNTER — Ambulatory Visit: Admission: RE | Admit: 2023-09-23 | Payer: Commercial Managed Care - PPO | Source: Ambulatory Visit

## 2023-10-13 ENCOUNTER — Encounter: Payer: Self-pay | Admitting: Gastroenterology

## 2023-10-13 ENCOUNTER — Ambulatory Visit: Payer: Commercial Managed Care - PPO | Admitting: Anesthesiology

## 2023-10-13 ENCOUNTER — Ambulatory Visit
Admission: RE | Admit: 2023-10-13 | Discharge: 2023-10-13 | Disposition: A | Discharge status: Home / Self Care | Payer: Commercial Managed Care - PPO | Source: Ambulatory Visit | Attending: Gastroenterology | Admitting: Gastroenterology

## 2023-10-13 ENCOUNTER — Encounter
Admission: RE | Disposition: A | Discharge status: Home / Self Care | Payer: Self-pay | Source: Ambulatory Visit | Attending: Gastroenterology

## 2023-10-13 DIAGNOSIS — R1084 Generalized abdominal pain: Secondary | ICD-10-CM

## 2023-10-13 DIAGNOSIS — K2100 Gastro-esophageal reflux disease with esophagitis, without bleeding: Secondary | ICD-10-CM | POA: Diagnosis not present

## 2023-10-13 DIAGNOSIS — K640 First degree hemorrhoids: Secondary | ICD-10-CM | POA: Diagnosis not present

## 2023-10-13 DIAGNOSIS — K635 Polyp of colon: Secondary | ICD-10-CM | POA: Diagnosis not present

## 2023-10-13 DIAGNOSIS — Z1211 Encounter for screening for malignant neoplasm of colon: Secondary | ICD-10-CM | POA: Diagnosis present

## 2023-10-13 DIAGNOSIS — R1013 Epigastric pain: Secondary | ICD-10-CM | POA: Insufficient documentation

## 2023-10-13 DIAGNOSIS — F17210 Nicotine dependence, cigarettes, uncomplicated: Secondary | ICD-10-CM | POA: Insufficient documentation

## 2023-10-13 DIAGNOSIS — K2210 Ulcer of esophagus without bleeding: Secondary | ICD-10-CM | POA: Diagnosis not present

## 2023-10-13 DIAGNOSIS — K31A11 Gastric intestinal metaplasia without dysplasia, involving the antrum: Secondary | ICD-10-CM | POA: Diagnosis not present

## 2023-10-13 DIAGNOSIS — D123 Benign neoplasm of transverse colon: Secondary | ICD-10-CM | POA: Diagnosis not present

## 2023-10-13 DIAGNOSIS — K2970 Gastritis, unspecified, without bleeding: Secondary | ICD-10-CM | POA: Insufficient documentation

## 2023-10-13 HISTORY — PX: COLONOSCOPY WITH PROPOFOL: SHX5780

## 2023-10-13 HISTORY — PX: BIOPSY: SHX5522

## 2023-10-13 HISTORY — PX: ESOPHAGOGASTRODUODENOSCOPY (EGD) WITH PROPOFOL: SHX5813

## 2023-10-13 HISTORY — DX: Epigastric pain: R10.13

## 2023-10-13 SURGERY — COLONOSCOPY WITH PROPOFOL
Anesthesia: General

## 2023-10-13 MED ORDER — LIDOCAINE HCL (CARDIAC) PF 100 MG/5ML IV SOSY
PREFILLED_SYRINGE | INTRAVENOUS | Status: DC | PRN
  Administered 2023-10-13: 100 mg via INTRAVENOUS

## 2023-10-13 MED ORDER — EPHEDRINE 5 MG/ML INJ
INTRAVENOUS | Status: AC
  Filled 2023-10-13: qty 5

## 2023-10-13 MED ORDER — PANTOPRAZOLE SODIUM 40 MG PO TBEC: 40.0000 mg | DELAYED_RELEASE_TABLET | Freq: Every day | ORAL | 11 refills | Status: DC

## 2023-10-13 MED ORDER — LIDOCAINE HCL (PF) 2 % IJ SOLN
INTRAMUSCULAR | Status: AC
  Filled 2023-10-13: qty 5

## 2023-10-13 MED ORDER — PROPOFOL 500 MG/50ML IV EMUL
INTRAVENOUS | Status: DC | PRN
  Administered 2023-10-13: 150 ug/kg/min via INTRAVENOUS

## 2023-10-13 MED ORDER — EPHEDRINE SULFATE-NACL 50-0.9 MG/10ML-% IV SOSY
PREFILLED_SYRINGE | INTRAVENOUS | Status: DC | PRN
  Administered 2023-10-13: 10 mg via INTRAVENOUS

## 2023-10-13 MED ORDER — PROPOFOL 1000 MG/100ML IV EMUL
INTRAVENOUS | Status: AC
  Filled 2023-10-13: qty 100

## 2023-10-13 MED ORDER — SODIUM CHLORIDE 0.9 % IV SOLN: INTRAVENOUS | Status: DC

## 2023-10-13 MED ORDER — PROPOFOL 10 MG/ML IV BOLUS
INTRAVENOUS | Status: DC | PRN
  Administered 2023-10-13: 100 mg via INTRAVENOUS

## 2023-10-13 NOTE — Anesthesia Preprocedure Evaluation (Addendum)
Anesthesia Evaluation  Patient identified by MRN, date of birth, ID band Patient awake    Reviewed: Allergy & Precautions, NPO status , Patient's Chart, lab work & pertinent test results  Airway Mallampati: II  TM Distance: >3 FB Neck ROM: Full    Dental  (+) Teeth Intact   Pulmonary neg pulmonary ROS, Current Smoker and Patient abstained from smoking.   Pulmonary exam normal  + decreased breath sounds      Cardiovascular Exercise Tolerance: Good negative cardio ROS Normal cardiovascular exam Rhythm:Regular Rate:Normal     Neuro/Psych negative neurological ROS  negative psych ROS   GI/Hepatic negative GI ROS, Neg liver ROS, PUD,,,  Endo/Other  negative endocrine ROS    Renal/GU Renal diseasenegative Renal ROS  negative genitourinary   Musculoskeletal   Abdominal Normal abdominal exam  (+)   Peds  Hematology negative hematology ROS (+)   Anesthesia Other Findings Past Medical History: No date: Epigastric pain  Past Surgical History: 05/24/2015: CARDIAC CATHETERIZATION; N/A     Comment:  Procedure: Left Heart Cath and Coronary Angiography;                Surgeon: Tonny Bollman, MD;  Location: [facility] INVASIVE CV               LAB;  Service: Cardiovascular;  Laterality: N/A;  BMI    Body Mass Index: 28.28 kg/m      Reproductive/Obstetrics negative OB ROS                             Anesthesia Physical Anesthesia Plan  ASA: 2  Anesthesia Plan: General   Post-op Pain Management:    Induction: Intravenous  PONV Risk Score and Plan: Propofol infusion and TIVA  Airway Management Planned: Natural Airway and Nasal Cannula  Additional Equipment:   Intra-op Plan:   Post-operative Plan:   Informed Consent: I have reviewed the patients History and Physical, chart, labs and discussed the procedure including the risks, benefits and alternatives for the proposed anesthesia with the  patient or authorized representative who has indicated his/her understanding and acceptance.     Dental Advisory Given  Plan Discussed with: CRNA and Surgeon  Anesthesia Plan Comments:        Anesthesia Quick Evaluation

## 2023-10-13 NOTE — H&P (Signed)
Midge Minium, MD [facility] [address], [address] [place], [place] [address] Phone:[phone] Fax : [phone]  Primary Care Physician:  Pcp, No Primary Gastroenterologist:  Dr. Servando Snare  Pre-Procedure History & Physical: HPI:  Tyler Bennett is a 52 y.o. male is here for an endoscopy and colonoscopy.   Past Medical History:  Diagnosis Date   Epigastric pain     Past Surgical History:  Procedure Laterality Date   CARDIAC CATHETERIZATION N/A 05/24/2015   Procedure: Left Heart Cath and Coronary Angiography;  Surgeon: Tonny Bollman, MD;  Location: [facility] INVASIVE CV LAB;  Service: Cardiovascular;  Laterality: N/A;    Prior to Admission medications   Medication Sig Start Date End Date Taking? Authorizing Provider  Acetaminophen (TYLENOL PO) Take 2 tablets by mouth daily as needed (pain,fever, headache). Patient not taking: Reported on 10/13/2023    [provider]  dicyclomine (BENTYL) 10 MG capsule Take 1 capsule (10 mg total) by mouth 3 (three) times daily before meals. Patient not taking: Reported on 10/13/2023 09/16/23 12/15/23  Celso Amy, PA-C  omeprazole (PRILOSEC OTC) 20 MG tablet Take by mouth. 09/13/23 09/27/23  [provider]  ondansetron (ZOFRAN) 4 MG tablet Take 1 tablet (4 mg total) by mouth every 8 (eight) hours as needed for nausea or vomiting. 09/11/23   Lurene Shadow, MD    Allergies as of 09/16/2023   (No Known Allergies)    Family History  Problem Relation Age of Onset   Diabetes type II Mother    Heart failure Mother    Hyperlipidemia Father    CAD Other     Social History   Socioeconomic History   Marital status: Single    Spouse name: Not on file   Number of children: Not on file   Years of education: Not on file   Highest education level: Not on file  Occupational History   Not on file  Tobacco Use   Smoking status: Every Day    Types: Cigarettes   Smokeless tobacco: Not on file  Vaping Use   Vaping status: Some Days   Substance and Sexual Activity   Alcohol use: No   Drug use: Not Currently    Types: Marijuana   Sexual activity: Not on file  Other Topics Concern   Not on file  Social History Narrative   Not on file   Social Determinants of Health   Financial Resource Strain: Not on file  Food Insecurity: No Food Insecurity (09/09/2023)   Hunger Vital Sign    Worried About Running Out of Food in the Last Year: Never true    Ran Out of Food in the Last Year: Never true  Transportation Needs: No Transportation Needs (09/09/2023)   PRAPARE - Administrator, Civil Service (Medical): No    Lack of Transportation (Non-Medical): No  Physical Activity: Not on file  Stress: Not on file  Social Connections: Not on file  Intimate Partner Violence: Not At Risk (09/09/2023)   Humiliation, Afraid, Rape, and Kick questionnaire    Fear of Current or Ex-Partner: No    Emotionally Abused: No    Physically Abused: No    Sexually Abused: No    Review of Systems: See HPI, otherwise negative ROS  Physical Exam: BP 124/84   Pulse 64   Temp (!) 96.5 F (35.8 C) (Temporal)   Resp 16   Wt 84.4 kg   SpO2 100%   BMI 28.28 kg/m  General:   Alert,  pleasant and cooperative in NAD Head:  Normocephalic and atraumatic. Neck:  Supple; no masses or thyromegaly. Lungs:  Clear throughout to auscultation.    Heart:  Regular rate and rhythm. Abdomen:  Soft, nontender and nondistended. Normal bowel sounds, without guarding, and without rebound.   Neurologic:  Alert and  oriented x4;  grossly normal neurologically.  Impression/Plan: Tyler Bennett is here for an endoscopy and colonoscopy to be performed for epigastric pain and screening  Risks, benefits, limitations, and alternatives regarding  endoscopy and colonoscopy have been reviewed with the patient.  Questions have been answered.  All parties agreeable.   Midge Minium, MD  10/13/2023, 10:48 AM

## 2023-10-13 NOTE — Transfer of Care (Signed)
Immediate Anesthesia Transfer of Care Note  Patient: Tyler Bennett  Procedure(s) Performed: COLONOSCOPY WITH PROPOFOL ESOPHAGOGASTRODUODENOSCOPY (EGD) WITH PROPOFOL BIOPSY  Patient Location: PACU  Anesthesia Type:General  Level of Consciousness: awake and sedated  Airway & Oxygen Therapy: Patient Spontanous Breathing and Patient connected to nasal cannula oxygen  Post-op Assessment: Report given to RN and Post -op Vital signs reviewed and stable  Post vital signs: Reviewed and stable  Last Vitals:  Vitals Value Taken Time  BP    Temp    Pulse    Resp    SpO2      Last Pain:  Vitals:   10/13/23 1009  TempSrc: Temporal         Complications: There were no known notable events for this encounter.

## 2023-10-13 NOTE — Progress Notes (Signed)
Patient and Marchelle Folks instructed to pick up Omeprazole at his [employer]. Patient says he didn't remember that he was suppose to be on the Omeprazole already. Stating "I don't take any medicine." Patient also thought he was suppose to have a CT scan today and didn't remember having a CT scan on 09/15/23.

## 2023-10-13 NOTE — Op Note (Signed)
[facility] Gastroenterology Patient Name: Tyler Bennett Procedure Date: 10/13/2023 10:52 AM MRN: [id number] Account #: [id number] Date of Birth: [birth year] Admit Type: Outpatient Age: 52 Room: [facility] ENDO ROOM 1 Gender: Male Note Status: Finalized Instrument Name: Nelda Marseille [id number] Procedure:             Colonoscopy Indications:           Screening for colorectal malignant neoplasm Providers:             Midge Minium MD, MD Referring MD:          No Local Md, MD (Referring MD) Medicines:             Propofol per Anesthesia Complications:         No immediate complications. Procedure:             Pre-Anesthesia Assessment:                        - Prior to the procedure, a History and Physical was                         performed, and patient medications and allergies were                         reviewed. The patient's tolerance of previous                         anesthesia was also reviewed. The risks and benefits                         of the procedure and the sedation options and risks                         were discussed with the patient. All questions were                         answered, and informed consent was obtained. Prior                         Anticoagulants: The patient has taken no anticoagulant                         or antiplatelet agents. ASA Grade Assessment: II - A                         patient with mild systemic disease. After reviewing                         the risks and benefits, the patient was deemed in                         satisfactory condition to undergo the procedure.                        After obtaining informed consent, the colonoscope was                         passed under direct vision. Throughout the procedure,  the patient's blood pressure, pulse, and oxygen                         saturations were monitored continuously. The                         Colonoscope was introduced through the  anus and                         advanced to the the cecum, identified by appendiceal                         orifice and ileocecal valve. The colonoscopy was                         performed without difficulty. The patient tolerated                         the procedure well. The quality of the bowel                         preparation was excellent. Findings:      The perianal and digital rectal examinations were normal.      Two sessile polyps were found in the sigmoid colon. The polyps were 3 to       4 mm in size. These polyps were removed with a cold snare. Resection and       retrieval were complete.      A 4 mm polyp was found in the transverse colon. The polyp was sessile.       The polyp was removed with a cold snare. Resection and retrieval were       complete.      Non-bleeding internal hemorrhoids were found during retroflexion. The       hemorrhoids were Grade I (internal hemorrhoids that do not prolapse). Impression:            - Two 3 to 4 mm polyps in the sigmoid colon, removed                         with a cold snare. Resected and retrieved.                        - One 4 mm polyp in the transverse colon, removed with                         a cold snare. Resected and retrieved.                        - Non-bleeding internal hemorrhoids. Recommendation:        - Discharge patient to home.                        - Resume previous diet.                        - Continue present medications.                        - Await pathology results.                        -  If the pathology report reveals adenomatous tissue,                         then repeat the colonoscopy for surveillance in 5                         years. Procedure Code(s):     --- Professional ---                        [phone], Colonoscopy, flexible; with removal of                         tumor(s), polyp(s), or other lesion(s) by snare                         technique Diagnosis Code(s):     ---  Professional ---                        Z12.11, Encounter for screening for malignant neoplasm                         of colon                        D12.5, Benign neoplasm of sigmoid colon CPT copyright 2022 American Medical Association. All rights reserved. The codes documented in this report are preliminary and upon coder review may  be revised to meet current compliance requirements. Midge Minium MD, MD 10/13/2023 11:33:16 AM This report has been signed electronically. Number of Addenda: 0 Note Initiated On: 10/13/2023 10:52 AM Scope Withdrawal Time: 0 hours 10 minutes 49 seconds  Total Procedure Duration: 0 hours 18 minutes 8 seconds  Estimated Blood Loss:  Estimated blood loss: none.      [facility]

## 2023-10-13 NOTE — Op Note (Signed)
[facility] Patient Name: Tyler Bennett Procedure Date: 10/13/2023 10:53 AM MRN: [id number] Account #: [id number] Date of Birth: 04-01-1971 Admit Type: Outpatient Age: 52 Room: [facility] ENDO ROOM 1 Gender: Male Note Status: Finalized Instrument Name: Upper Endoscope [id number] Procedure:             Upper GI endoscopy Indications:           Epigastric abdominal pain Providers:             Midge Minium MD, MD Referring MD:          No Local Md, MD (Referring MD) Medicines:             Propofol per Anesthesia Complications:         No immediate complications. Procedure:             Pre-Anesthesia Assessment:                        - Prior to the procedure, a History and Physical was                         performed, and patient medications and allergies were                         reviewed. The patient's tolerance of previous                         anesthesia was also reviewed. The risks and benefits                         of the procedure and the sedation options and risks                         were discussed with the patient. All questions were                         answered, and informed consent was obtained. Prior                         Anticoagulants: The patient has taken no anticoagulant                         or antiplatelet agents. ASA Grade Assessment: II - A                         patient with mild systemic disease. After reviewing                         the risks and benefits, the patient was deemed in                         satisfactory condition to undergo the procedure.                        After obtaining informed consent, the endoscope was                         passed under direct vision. Throughout the procedure,  the patient's blood pressure, pulse, and oxygen                         saturations were monitored continuously. The Endoscope                         was introduced through the mouth, and  advanced to the                         second part of duodenum. The upper GI endoscopy was                         accomplished without difficulty. The patient tolerated                         the procedure well. Findings:      One cratered esophageal ulcer with no stigmata of recent bleeding was       found at the gastroesophageal junction.      LA Grade A (one or more mucosal breaks less than 5 mm, not extending       between tops of 2 mucosal folds) esophagitis with no bleeding was found       at the gastroesophageal junction.      Localized mild inflammation characterized by erythema was found in the       gastric antrum. Biopsies were taken with a cold forceps for histology.      The examined duodenum was normal. Impression:            - Esophageal ulcer with no stigmata of recent bleeding.                        - LA Grade A esophagitis with no bleeding.                        - Gastritis. Biopsied.                        - Normal examined duodenum. Recommendation:        - Discharge patient to home.                        - Resume previous diet.                        - Continue present medications.                        - Await pathology results.                        - Perform a colonoscopy today.                        - No aspirin, ibuprofen, naproxen, or other                         non-steroidal anti-inflammatory drugs. Procedure Code(s):     --- Professional ---                        [phone], Esophagogastroduodenoscopy, flexible,  transoral; with biopsy, single or multiple Diagnosis Code(s):     --- Professional ---                        K20.90, Esophagitis, unspecified without bleeding                        K22.10, Ulcer of esophagus without bleeding CPT copyright 2022 American Medical Association. All rights reserved. The codes documented in this report are preliminary and upon coder review may  be revised to meet current compliance  requirements. Midge Minium MD, MD 10/13/2023 11:12:30 AM This report has been signed electronically. Number of Addenda: 0 Note Initiated On: 10/13/2023 10:53 AM Estimated Blood Loss:  Estimated blood loss: none.      [facility]

## 2023-10-13 NOTE — Anesthesia Postprocedure Evaluation (Signed)
Anesthesia Post Note  Patient: Tyler Bennett  Procedure(s) Performed: COLONOSCOPY WITH PROPOFOL ESOPHAGOGASTRODUODENOSCOPY (EGD) WITH PROPOFOL BIOPSY  Patient location during evaluation: PACU Anesthesia Type: General Level of consciousness: awake and awake and alert Pain management: satisfactory to patient Vital Signs Assessment: post-procedure vital signs reviewed and stable Respiratory status: spontaneous breathing and nonlabored ventilation Cardiovascular status: stable Anesthetic complications: no   There were no known notable events for this encounter.   Last Vitals:  Vitals:   10/13/23 1146 10/13/23 1156  BP: 110/72   Pulse: 67 74  Resp: 19 10  Temp:    SpO2: 98% 100%    Last Pain:  Vitals:   10/13/23 1146  TempSrc:   PainSc: 0-No pain                 VAN STAVEREN,Larone Kliethermes

## 2023-10-18 ENCOUNTER — Encounter: Payer: Self-pay | Admitting: Gastroenterology

## 2023-10-18 LAB — SURGICAL PATHOLOGY

## 2023-11-15 NOTE — Progress Notes (Deleted)
 Ellouise Console, PA-C [address]  [address]  [place], [place] [address]  Main: [phone]  Fax: [phone]   Primary Care Physician: Pcp, No  Primary Gastroenterologist:  Ellouise Console, PA-C / Dr. Rogelia Copping    CC: Follow-up nausea, vomiting, abdominal pain, liver lesion  HPI: Tyler Bennett is a 52 y.o. male returns for 50-month follow-up of chronic nausea, vomiting, abdominal pain, constipation, liver lesion, and chronic marijuana use.  2 months ago was started on Linzess 72/145, and dicyclomine  10 Mg 3 times daily as needed.  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  10/13/2023 EGD by Dr. Copping: 1 cratered esophageal ulcer at the GE junction, LA grade A distal esophagitis, mild gastritis, normal duodenum.  Biopsies negative for H. pylori, dysplasia, or malignancy.  Biopsies consistent with erosive reflux gastroesophagitis.  10/13/2023 colonoscopy: 3 small (3 mm to 4 mm) tubular adenoma and hyperplastic polyps removed.  Grade 1 internal hemorrhoids.  Excellent prep.  5 to 7 year repeat.  Abdominal MRI with and without contrast was ordered to follow-up with liver lesion.  Patient has not yet completed MRI.  Abd / Pelvic CT w/ contrast 09/15/23: No acute findings.1.4 cm indeterminate low-attenuation lesion in posterior right hepatic lobe. Recommend nonemergent outpatient abdomen MRI without and with contrast for further characterization.   Lab 09/15/23: Glucose 161, BUN 25, Cr 1.21, GFR > 60, T. Bili 1.5, All other LFTs normal.  Elevated WBC 14.6, Hgb 18.1.  Normal Lipase.   Medical history significant of tobacco abuse, marijuana use, recurrent nausea and vomiting, hyperglycemia, who was admitted to [facility] hospital 09/09/23 - 09/11/23 with 3-day history of intractable nausea, vomiting and abdominal pain.  Intermittent episodes for approximately 5 years.  Episodes occur approximately once a year. Chart review shows that he was  hospitalized in June 2015 for possible acute gastroenteritis and in July 2016 for chest pain and intractable nausea and vomiting.    Current Outpatient Medications  Medication Sig Dispense Refill   Acetaminophen  (TYLENOL  PO) Take 2 tablets by mouth daily as needed (pain,fever, headache). (Patient not taking: Reported on 10/13/2023)     dicyclomine  (BENTYL ) 10 MG capsule Take 1 capsule (10 mg total) by mouth 3 (three) times daily before meals. (Patient not taking: Reported on 10/13/2023) 90 capsule 2   omeprazole  (PRILOSEC OTC) 20 MG tablet Take by mouth.     ondansetron  (ZOFRAN ) 4 MG tablet Take 1 tablet (4 mg total) by mouth every 8 (eight) hours as needed for nausea or vomiting. 12 tablet 0   pantoprazole  (PROTONIX ) 40 MG tablet Take 1 tablet (40 mg total) by mouth daily. 30 tablet 11   No current facility-administered medications for this visit.    Allergies as of 11/16/2023   (No Known Allergies)    Past Medical History:  Diagnosis Date   Epigastric pain     Past Surgical History:  Procedure Laterality Date   BIOPSY  10/13/2023   Procedure: BIOPSY;  Surgeon: Copping Rogelia, MD;  Location: [facility] ENDOSCOPY;  Service: Endoscopy;;   CARDIAC CATHETERIZATION N/A 05/24/2015   Procedure: Left Heart Cath and Coronary Angiography;  Surgeon: Ozell Fell, MD;  [facility] INVASIVE CV LAB;  Service: Cardiovascular;  Laterality: N/A;   COLONOSCOPY WITH PROPOFOL  N/A 10/13/2023   Procedure: COLONOSCOPY WITH PROPOFOL ;  Surgeon: Copping Rogelia, MD;  Location: [facility] ENDOSCOPY;  Service: Endoscopy;  Laterality: N/A;   ESOPHAGOGASTRODUODENOSCOPY (EGD) WITH PROPOFOL  N/A 10/13/2023   Procedure: ESOPHAGOGASTRODUODENOSCOPY (EGD)  WITH PROPOFOL ;  Surgeon: Jinny Carmine, MD;  Location: [facility] ENDOSCOPY;  Service: Endoscopy;  Laterality: N/A;    Review of Systems:    All systems reviewed and negative except where noted in HPI.   Physical Examination:   There were no vitals taken for this visit.  General:  Well-nourished, well-developed in no acute distress.  Lungs: Clear to auscultation bilaterally. Non-labored. Heart: Regular rate and rhythm, no murmurs rubs or gallops.  Abdomen: Bowel sounds are normal; Abdomen is Soft; No hepatosplenomegaly, masses or hernias;  No Abdominal Tenderness; No guarding or rebound tenderness. Neuro: Alert and oriented x 3.  Grossly intact.  Psych: Alert and cooperative, normal mood and affect.   Imaging Studies: No results found.  Assessment and Plan:   Tyler Bennett is a 52 y.o. y/o male returns for follow-up of:  1.  Esophageal ulcer; biopsies negative for H. Pylori  Avoid all NSAIDs  Avoid alcohol  Avoid marijuana use  2.  GERD with LA grade a esophagitis  Continue PPI.   Increase pantoprazole  to 40 Mg twice daily for 3 months.  3.  Gastritis; biopsies negative for H. Pylori  Rx sucralfate ?  4.  Chronic constipation  Continue Linzess?  5.  1.4 cm right hepatic lobe lesion  Schedule follow-up abdominal MRI    Ellouise Console, PA-C  Follow up ***  BP check ***

## 2023-11-16 ENCOUNTER — Ambulatory Visit: Payer: Commercial Managed Care - PPO | Admitting: Physician Assistant

## 2024-07-27 ENCOUNTER — Emergency Department

## 2024-07-27 ENCOUNTER — Emergency Department
Admission: EM | Admit: 2024-07-27 | Discharge: 2024-07-27 | Disposition: A | Discharge status: Home / Self Care | Source: Home / Self Care | Attending: Emergency Medicine | Admitting: Emergency Medicine

## 2024-07-27 ENCOUNTER — Other Ambulatory Visit: Payer: Self-pay

## 2024-07-27 DIAGNOSIS — R079 Chest pain, unspecified: Secondary | ICD-10-CM | POA: Insufficient documentation

## 2024-07-27 DIAGNOSIS — F1290 Cannabis use, unspecified, uncomplicated: Secondary | ICD-10-CM | POA: Diagnosis not present

## 2024-07-27 DIAGNOSIS — R0602 Shortness of breath: Secondary | ICD-10-CM | POA: Insufficient documentation

## 2024-07-27 DIAGNOSIS — E876 Hypokalemia: Secondary | ICD-10-CM | POA: Diagnosis not present

## 2024-07-27 DIAGNOSIS — N179 Acute kidney failure, unspecified: Secondary | ICD-10-CM | POA: Diagnosis not present

## 2024-07-27 DIAGNOSIS — R1013 Epigastric pain: Secondary | ICD-10-CM | POA: Diagnosis present

## 2024-07-27 DIAGNOSIS — K277 Chronic peptic ulcer, site unspecified, without hemorrhage or perforation: Secondary | ICD-10-CM | POA: Diagnosis not present

## 2024-07-27 DIAGNOSIS — R112 Nausea with vomiting, unspecified: Secondary | ICD-10-CM | POA: Diagnosis not present

## 2024-07-27 DIAGNOSIS — R0789 Other chest pain: Secondary | ICD-10-CM | POA: Diagnosis not present

## 2024-07-27 DIAGNOSIS — K209 Esophagitis, unspecified without bleeding: Secondary | ICD-10-CM | POA: Diagnosis not present

## 2024-07-27 DIAGNOSIS — Z79899 Other long term (current) drug therapy: Secondary | ICD-10-CM | POA: Diagnosis not present

## 2024-07-27 LAB — CBC
HCT: 48.9 % (ref 39.0–52.0)
Hemoglobin: 16 g/dL (ref 13.0–17.0)
MCH: 31.4 pg (ref 26.0–34.0)
MCHC: 32.7 g/dL (ref 30.0–36.0)
MCV: 96.1 fL (ref 80.0–100.0)
Platelets: 209 K/uL (ref 150–400)
RBC: 5.09 MIL/uL (ref 4.22–5.81)
RDW: 13.8 % (ref 11.5–15.5)
WBC: 13.1 K/uL — ABNORMAL HIGH (ref 4.0–10.5)
nRBC: 0 % (ref 0.0–0.2)

## 2024-07-27 LAB — TROPONIN I (HIGH SENSITIVITY): Troponin I (High Sensitivity): 5 ng/L (ref <18)

## 2024-07-27 LAB — COMPREHENSIVE METABOLIC PANEL WITH GFR
ALT: 18 U/L (ref 0–44)
AST: 37 U/L (ref 15–41)
Albumin: 5.3 g/dL — ABNORMAL HIGH (ref 3.5–5.0)
Alkaline Phosphatase: 101 U/L (ref 38–126)
Anion gap: 18 — ABNORMAL HIGH (ref 5–15)
BUN: 20 mg/dL (ref 6–20)
CO2: 16 mmol/L — ABNORMAL LOW (ref 22–32)
Calcium: 10.7 mg/dL — ABNORMAL HIGH (ref 8.9–10.3)
Chloride: 106 mmol/L (ref 98–111)
Creatinine, Ser: 1.37 mg/dL — ABNORMAL HIGH (ref 0.61–1.24)
GFR, Estimated: >60 mL/min (ref >60)
Glucose, Bld: 202 mg/dL — ABNORMAL HIGH (ref 70–99)
Potassium: 3.8 mmol/L (ref 3.5–5.1)
Sodium: 140 mmol/L (ref 135–145)
Total Bilirubin: 1.1 mg/dL (ref 0.0–1.2)
Total Protein: 9.3 g/dL — ABNORMAL HIGH (ref 6.5–8.1)

## 2024-07-27 LAB — LIPASE, BLOOD: Lipase: 25 U/L (ref 11–51)

## 2024-07-27 LAB — BASIC METABOLIC PANEL WITH GFR
Anion gap: 12 (ref 5–15)
BUN: 18 mg/dL (ref 6–20)
CO2: 20 mmol/L — ABNORMAL LOW (ref 22–32)
Calcium: 9.1 mg/dL (ref 8.9–10.3)
Chloride: 109 mmol/L (ref 98–111)
Creatinine, Ser: 1.24 mg/dL (ref 0.61–1.24)
GFR, Estimated: >60 mL/min (ref >60)
Glucose, Bld: 145 mg/dL — ABNORMAL HIGH (ref 70–99)
Potassium: 3.8 mmol/L (ref 3.5–5.1)
Sodium: 141 mmol/L (ref 135–145)

## 2024-07-27 MED ORDER — ONDANSETRON HCL 4 MG/2ML IJ SOLN
4.0000 mg | Freq: Once | INTRAMUSCULAR | Status: AC
Start: 2024-07-27 — End: 2024-07-27

## 2024-07-27 MED ORDER — HYDROCODONE-ACETAMINOPHEN 5-325 MG PO TABS: 1.0000 | ORAL_TABLET | ORAL | 0 refills | Status: AC | PRN

## 2024-07-27 MED ORDER — METOCLOPRAMIDE HCL 5 MG/ML IJ SOLN
10.0000 mg | Freq: Once | INTRAMUSCULAR | Status: AC
  Administered 2024-07-27: 10 mg via INTRAVENOUS
  Filled 2024-07-27: qty 2

## 2024-07-27 MED ORDER — SODIUM CHLORIDE 0.9 % IV BOLUS
1000.0000 mL | Freq: Once | INTRAVENOUS | Status: AC
  Administered 2024-07-27: 1000 mL via INTRAVENOUS

## 2024-07-27 MED ORDER — SODIUM CHLORIDE 0.9 % IV BOLUS
500.0000 mL | Freq: Once | INTRAVENOUS | Status: AC
Start: 2024-07-27 — End: 2024-07-27
  Administered 2024-07-27: 500 mL via INTRAVENOUS

## 2024-07-27 MED ORDER — ONDANSETRON 4 MG PO TBDP: 4.0000 mg | ORAL_TABLET | Freq: Three times a day (TID) | ORAL | 0 refills | Status: AC | PRN

## 2024-07-27 MED ORDER — MORPHINE SULFATE (PF) 4 MG/ML IV SOLN
4.0000 mg | Freq: Once | INTRAVENOUS | Status: AC
  Administered 2024-07-27: 4 mg via INTRAVENOUS
  Filled 2024-07-27: qty 1

## 2024-07-27 MED ORDER — ONDANSETRON HCL 4 MG/2ML IJ SOLN
INTRAMUSCULAR | Status: AC
  Administered 2024-07-27: 4 mg via INTRAVENOUS
  Filled 2024-07-27: qty 2

## 2024-07-27 NOTE — Discharge Instructions (Signed)
 As we discussed please drink plenty fluids over the next several days.  Please take your pain and nausea medication as needed but only as prescribed.  Do not drink alcohol or drive while taking pain medication.  Return to the emergency department for any return of any chest pain or any other symptom personally concerning to yourself.

## 2024-07-27 NOTE — ED Triage Notes (Addendum)
 Pt to ED via POV from home. Pt reports yesterday was working and started to have SOB. Pt reports this morning woke up with centralized CP, shakes, N/V. Pt vomiting, pale and diaphoretic on arrival.   Pt reports hx of stomach ulcers

## 2024-07-27 NOTE — ED Provider Notes (Signed)
 [facility] Provider Note    Event Date/Time   First MD Initiated Contact with Patient 07/27/24 1705     (approximate)  History   Chief Complaint: Shortness of Breath, Emesis, and Chest Pain  HPI  Tyler Bennett is a 53 y.o. male with a past medical history of epigastric pain, ulcers, presents to the emergency department for nausea vomiting and chest pain.  According to the patient since yesterday he has been nauseated with frequent episodes of vomiting and experiencing pain in the epigastrium into the chest.  Patient states some shortness of breath since last night as well.  Here patient's main complaint is pain in the epigastrium and feeling nauseated.  Physical Exam   Triage Vital Signs: ED Triage Vitals  Encounter Vitals Group     BP 07/27/24 1637 (!) 140/103     Girls Systolic BP Percentile --      Girls Diastolic BP Percentile --      Boys Systolic BP Percentile --      Boys Diastolic BP Percentile --      Pulse Rate 07/27/24 1635 98     Resp 07/27/24 1635 18     Temp 07/27/24 1635 97.6 F (36.4 C)     Temp Source 07/27/24 1635 Oral     SpO2 07/27/24 1635 98 %     Weight --      Height --      Head Circumference --      Peak Flow --      Pain Score 07/27/24 1636 7     Pain Loc --      Pain Education --      Exclude from Growth Chart --     Most recent vital signs: Vitals:   07/27/24 1635 07/27/24 1637  BP:  (!) 140/103  Pulse: 98   Resp: 18   Temp: 97.6 F (36.4 C)   SpO2: 98%     General: Awake, no distress.  CV:  Good peripheral perfusion.  Regular rate and rhythm  Resp:  Normal effort.  Equal breath sounds bilaterally.  Abd:  No distention.  Soft, mild epigastric tenderness.  ED Results / Procedures / Treatments   EKG  EKG viewed and interpreted by myself shows a normal sinus rhythm at 60 bpm with a narrow QRS, normal axis, normal intervals, no concerning ST changes.  Reassuring EKG.  RADIOLOGY  I have reviewed and  interpreted chest x-ray images.  No obvious consolidation on my evaluation. Radiology has read the x-ray is negative   MEDICATIONS ORDERED IN ED: Medications  morphine  (PF) 4 MG/ML injection 4 mg (has no administration in time range)  metoCLOPramide  (REGLAN ) injection 10 mg (has no administration in time range)  sodium chloride  0.9 % bolus 1,000 mL (has no administration in time range)  sodium chloride  0.9 % bolus 500 mL (500 mLs Intravenous New Bag/Given 07/27/24 1648)  ondansetron  (ZOFRAN ) injection 4 mg (4 mg Intravenous Given 07/27/24 1647)     IMPRESSION / MDM / ASSESSMENT AND PLAN / ED COURSE  I reviewed the triage vital signs and the nursing notes.  Patient's presentation is most consistent with acute presentation with potential threat to life or bodily function.  Patient presents to the emergency department for chest pain nausea vomiting starting last night.  Overall the patient appears well, no distress.  Patient's only complaint currently is nausea and feeling cold per patient.  We will check labs including a CBC chemistry and troponin  and lipase.  Will treat pain and nausea and continue to closely monitor.  EKG is reassuring, chest x-ray is clear.  Patient's lab work resulted showing mild leukocytosis of 13,000.  Patient's chemistry does show signs of dehydration with a bicarb of 16 anion gap of 18.  Patient given IV fluids.  Chest x-ray is clear.  After pain medication nausea medication and IV fluids patient states his pain is gone.  A repeat chemistry is reassuring with a closed anion gap of 12.  As the patient states he is feeling better he is pain-free we will discharge the patient home with a short course of pain and nausea medication should his pain return or nausea return.  I discussed with the patient increasing his oral hydration over the next few days and following up with his doctor soon as he is able.  I also discussed return precautions for any return of any chest pain or  any trouble breathing.  Patient agreeable to plan.  FINAL CLINICAL IMPRESSION(S) / ED DIAGNOSES   Nausea vomiting Chest pain  Note:  This document was prepared using Dragon voice recognition software and may include unintentional dictation errors.   Dorothyann Drivers, MD 07/27/24 2222

## 2024-07-27 NOTE — ED Notes (Signed)
 Patient states he does not have a legal guardian. Patient is alert and oriented and cares for himself at home.

## 2024-07-29 ENCOUNTER — Emergency Department

## 2024-07-29 ENCOUNTER — Observation Stay
Admission: EM | Admit: 2024-07-29 | Discharge: 2024-07-31 | Disposition: A | Discharge status: Home / Self Care | Attending: Hospitalist | Admitting: Hospitalist

## 2024-07-29 ENCOUNTER — Other Ambulatory Visit: Payer: Self-pay

## 2024-07-29 DIAGNOSIS — F1290 Cannabis use, unspecified, uncomplicated: Secondary | ICD-10-CM | POA: Insufficient documentation

## 2024-07-29 DIAGNOSIS — K279 Peptic ulcer, site unspecified, unspecified as acute or chronic, without hemorrhage or perforation: Secondary | ICD-10-CM | POA: Insufficient documentation

## 2024-07-29 DIAGNOSIS — E876 Hypokalemia: Secondary | ICD-10-CM | POA: Insufficient documentation

## 2024-07-29 DIAGNOSIS — Z79899 Other long term (current) drug therapy: Secondary | ICD-10-CM | POA: Insufficient documentation

## 2024-07-29 DIAGNOSIS — K209 Esophagitis, unspecified without bleeding: Secondary | ICD-10-CM | POA: Insufficient documentation

## 2024-07-29 DIAGNOSIS — R112 Nausea with vomiting, unspecified: Principal | ICD-10-CM | POA: Insufficient documentation

## 2024-07-29 DIAGNOSIS — K277 Chronic peptic ulcer, site unspecified, without hemorrhage or perforation: Secondary | ICD-10-CM | POA: Insufficient documentation

## 2024-07-29 DIAGNOSIS — R0602 Shortness of breath: Secondary | ICD-10-CM | POA: Insufficient documentation

## 2024-07-29 DIAGNOSIS — N179 Acute kidney failure, unspecified: Secondary | ICD-10-CM | POA: Insufficient documentation

## 2024-07-29 DIAGNOSIS — R0789 Other chest pain: Secondary | ICD-10-CM | POA: Insufficient documentation

## 2024-07-29 LAB — BASIC METABOLIC PANEL WITH GFR
Anion gap: 17 — ABNORMAL HIGH (ref 5–15)
Anion gap: 10 (ref 5–15)
BUN: 24 mg/dL — ABNORMAL HIGH (ref 6–20)
BUN: 24 mg/dL — ABNORMAL HIGH (ref 6–20)
CO2: 20 mmol/L — ABNORMAL LOW (ref 22–32)
CO2: 24 mmol/L (ref 22–32)
Calcium: 10 mg/dL (ref 8.9–10.3)
Calcium: 8.8 mg/dL — ABNORMAL LOW (ref 8.9–10.3)
Chloride: 99 mmol/L (ref 98–111)
Chloride: 100 mmol/L (ref 98–111)
Creatinine, Ser: 1.36 mg/dL — ABNORMAL HIGH (ref 0.61–1.24)
Creatinine, Ser: 1.45 mg/dL — ABNORMAL HIGH (ref 0.61–1.24)
GFR, Estimated: >60 mL/min (ref >60)
GFR, Estimated: 57 mL/min — ABNORMAL LOW (ref >60)
Glucose, Bld: 138 mg/dL — ABNORMAL HIGH (ref 70–99)
Glucose, Bld: 116 mg/dL — ABNORMAL HIGH (ref 70–99)
Potassium: 3.5 mmol/L (ref 3.5–5.1)
Potassium: 3.6 mmol/L (ref 3.5–5.1)
Sodium: 136 mmol/L (ref 135–145)
Sodium: 134 mmol/L — ABNORMAL LOW (ref 135–145)

## 2024-07-29 LAB — TROPONIN I (HIGH SENSITIVITY)
Troponin I (High Sensitivity): 8 ng/L (ref <18)
Troponin I (High Sensitivity): 6 ng/L (ref <18)

## 2024-07-29 LAB — LACTIC ACID, PLASMA
Lactic Acid, Venous: 3 mmol/L (ref 0.5–1.9)
Lactic Acid, Venous: 2 mmol/L (ref 0.5–1.9)
Lactic Acid, Venous: 1.5 mmol/L (ref 0.5–1.9)

## 2024-07-29 LAB — PROCALCITONIN: Procalcitonin: <0.1 ng/mL

## 2024-07-29 LAB — CBC
HCT: 51 % (ref 39.0–52.0)
Hemoglobin: 17.7 g/dL — ABNORMAL HIGH (ref 13.0–17.0)
MCH: 31.5 pg (ref 26.0–34.0)
MCHC: 34.7 g/dL (ref 30.0–36.0)
MCV: 90.7 fL (ref 80.0–100.0)
Platelets: 287 K/uL (ref 150–400)
RBC: 5.62 MIL/uL (ref 4.22–5.81)
RDW: 13.4 % (ref 11.5–15.5)
WBC: 13.8 K/uL — ABNORMAL HIGH (ref 4.0–10.5)
nRBC: 0 % (ref 0.0–0.2)

## 2024-07-29 MED ORDER — ONDANSETRON HCL 4 MG/2ML IJ SOLN
4.0000 mg | Freq: Four times a day (QID) | INTRAMUSCULAR | Status: DC | PRN
  Administered 2024-07-30: 4 mg via INTRAVENOUS
  Filled 2024-07-29: qty 2

## 2024-07-29 MED ORDER — ONDANSETRON HCL 4 MG/2ML IJ SOLN
4.0000 mg | Freq: Once | INTRAMUSCULAR | Status: AC
  Administered 2024-07-29: 4 mg via INTRAVENOUS
  Filled 2024-07-29: qty 2

## 2024-07-29 MED ORDER — MORPHINE SULFATE (PF) 4 MG/ML IV SOLN
4.0000 mg | Freq: Once | INTRAVENOUS | Status: AC
  Administered 2024-07-29: 4 mg via INTRAVENOUS
  Filled 2024-07-29: qty 1

## 2024-07-29 MED ORDER — LACTATED RINGERS IV BOLUS (SEPSIS)
1000.0000 mL | Freq: Once | INTRAVENOUS | Status: AC
  Administered 2024-07-29: 1000 mL via INTRAVENOUS

## 2024-07-29 MED ORDER — VANCOMYCIN HCL IN DEXTROSE 1-5 GM/200ML-% IV SOLN
1000.0000 mg | Freq: Once | INTRAVENOUS | Status: DC
  Filled 2024-07-29: qty 200

## 2024-07-29 MED ORDER — METRONIDAZOLE 500 MG/100ML IV SOLN
500.0000 mg | Freq: Once | INTRAVENOUS | Status: AC
  Administered 2024-07-29: 500 mg via INTRAVENOUS
  Filled 2024-07-29: qty 100

## 2024-07-29 MED ORDER — MORPHINE SULFATE (PF) 2 MG/ML IV SOLN
2.0000 mg | INTRAVENOUS | Status: DC | PRN
  Administered 2024-07-30: 2 mg via INTRAVENOUS
  Filled 2024-07-29: qty 1

## 2024-07-29 MED ORDER — HYDROCODONE-ACETAMINOPHEN 5-325 MG PO TABS: 1.0000 | ORAL_TABLET | ORAL | Status: DC | PRN

## 2024-07-29 MED ORDER — ONDANSETRON HCL 4 MG PO TABS: 4.0000 mg | ORAL_TABLET | Freq: Four times a day (QID) | ORAL | Status: DC | PRN

## 2024-07-29 MED ORDER — ACETAMINOPHEN 325 MG PO TABS
650.0000 mg | ORAL_TABLET | Freq: Four times a day (QID) | ORAL | Status: DC | PRN
  Administered 2024-07-31: 650 mg via ORAL
  Filled 2024-07-29: qty 2

## 2024-07-29 MED ORDER — SODIUM CHLORIDE 0.9 % IV SOLN
2.0000 g | Freq: Once | INTRAVENOUS | Status: AC
  Administered 2024-07-29: 2 g via INTRAVENOUS
  Filled 2024-07-29: qty 12.5

## 2024-07-29 MED ORDER — VANCOMYCIN HCL 1750 MG/350ML IV SOLN
1750.0000 mg | Freq: Once | INTRAVENOUS | Status: AC
  Administered 2024-07-29: 1750 mg via INTRAVENOUS
  Filled 2024-07-29: qty 350

## 2024-07-29 MED ORDER — ACETAMINOPHEN 650 MG RE SUPP: 650.0000 mg | Freq: Four times a day (QID) | RECTAL | Status: DC | PRN

## 2024-07-29 MED ORDER — LACTATED RINGERS IV SOLN
INTRAVENOUS | Status: DC
Start: 2024-07-29 — End: 2024-08-10

## 2024-07-29 MED ORDER — LACTATED RINGERS IV SOLN
INTRAVENOUS | Status: DC
Start: 2024-07-29 — End: 2024-07-30

## 2024-07-29 MED ORDER — IOHEXOL 300 MG/ML  SOLN
100.0000 mL | Freq: Once | INTRAMUSCULAR | Status: AC | PRN
  Administered 2024-07-29: 100 mL via INTRAVENOUS

## 2024-07-29 MED ORDER — PANTOPRAZOLE SODIUM 40 MG IV SOLR
40.0000 mg | INTRAVENOUS | Status: DC
  Administered 2024-07-29: 40 mg via INTRAVENOUS
  Filled 2024-07-29: qty 10

## 2024-07-29 NOTE — Assessment & Plan Note (Signed)
 Secondary to ATN from dehydration from intractable vomiting IV hydration Monitor renal function and avoid nephrotoxins

## 2024-07-29 NOTE — ED Triage Notes (Signed)
 Pt presents POV vomiting on arrival. With complaints of chest pain, dizziness and abd pain x 5 days. Unable to keep anything down.

## 2024-07-29 NOTE — H&P (Signed)
 History and Physical    Patient: Tyler Bennett FMW:[id number] DOB: [birth year] DOA: 07/29/2024 DOS: the patient was seen and examined on 07/29/2024 PCP: Pcp, No  Patient coming from: Home  Chief Complaint:  Chief Complaint  Patient presents with   Chest Pain   Emesis    HPI: Tyler Bennett is a 53 y.o. male with medical history significant for Esophageal ulcer on EGD 09/2023 following a hospitalization for intractable vomiting, of which she has had several, who returns to the ED for the second time in 3 days with intractable nausea, vomiting and abdominal pain similar to prior episodes.  He denies fever or chills.  On his first visit he was found to have AKI and lactic acidosis and he was hydrated with some improvement and discharged however he returned with recurrent symptoms and unable to tolerate orally. In the ED hypertensive with otherwise normal vitals. Labs notable for anion gap metabolic acidosis, mild AKI and mild leukocytosis of 13.8.  Lactic acid 2, down from 3 at his prior visit.  Troponin negative. EKG nonischemic CT abdomen and pelvis nonacute and chest x-ray also nonacute. Patient was treated with LR boluses and empirically started on antibiotics for possible sepsis. Admission requested     Review of Systems: As mentioned in the history of present illness. All other systems reviewed and are negative.  Past Medical History:  Diagnosis Date   Epigastric pain    Past Surgical History:  Procedure Laterality Date   BIOPSY  10/13/2023   Procedure: BIOPSY;  Surgeon: Jinny Carmine, MD;  Location: [facility] ENDOSCOPY;  Service: Endoscopy;;   CARDIAC CATHETERIZATION N/A 05/24/2015   Procedure: Left Heart Cath and Coronary Angiography;  Surgeon: Ozell Fell, MD;  Location: [facility] INVASIVE CV LAB;  Service: Cardiovascular;  Laterality: N/A;   COLONOSCOPY WITH PROPOFOL  N/A 10/13/2023   Procedure: COLONOSCOPY WITH PROPOFOL ;  Surgeon: Jinny Carmine, MD;  Location: [facility] ENDOSCOPY;   Service: Endoscopy;  Laterality: N/A;   ESOPHAGOGASTRODUODENOSCOPY (EGD) WITH PROPOFOL  N/A 10/13/2023   Procedure: ESOPHAGOGASTRODUODENOSCOPY (EGD) WITH PROPOFOL ;  Surgeon: Jinny Carmine, MD;  Location: [facility] ENDOSCOPY;  Service: Endoscopy;  Laterality: N/A;   Social History:  reports that he has been smoking cigarettes. He does not have any smokeless tobacco history on file. He reports current alcohol use of about 1.0 standard drink of alcohol per week. He reports current drug use. Drug: Marijuana.  No Known Allergies  Family History  Problem Relation Age of Onset   Diabetes type II Mother    Heart failure Mother    Hyperlipidemia Father    CAD Other     Prior to Admission medications   Medication Sig Start Date End Date Taking? Authorizing Provider  Acetaminophen  (TYLENOL  PO) Take 2 tablets by mouth daily as needed (pain,fever, headache). Patient not taking: Reported on 10/13/2023    [provider]  dicyclomine  (BENTYL ) 10 MG capsule Take 1 capsule (10 mg total) by mouth 3 (three) times daily before meals. Patient not taking: Reported on 10/13/2023 09/16/23 12/15/23  [place], PA-C  HYDROcodone -acetaminophen  (NORCO/VICODIN) 5-325 MG tablet Take 1 tablet by mouth every 4 (four) hours as needed. 07/27/24   Dorothyann Drivers, MD  omeprazole  (PRILOSEC OTC) 20 MG tablet Take by mouth. 09/13/23 09/27/23  [provider]  ondansetron  (ZOFRAN ) 4 MG tablet Take 1 tablet (4 mg total) by mouth every 8 (eight) hours as needed for nausea or vomiting. 09/11/23   Jens Durand, MD  ondansetron  (ZOFRAN -ODT) 4 MG disintegrating tablet Take 1 tablet (4  mg total) by mouth every 8 (eight) hours as needed for nausea or vomiting. 07/27/24   Dorothyann Drivers, MD  pantoprazole  (PROTONIX ) 40 MG tablet Take 1 tablet (40 mg total) by mouth daily. 10/13/23   Jinny Carmine, MD    Physical Exam: Vitals:   07/29/24 1814 07/29/24 1830 07/29/24 1900 07/29/24 1930  BP:  (!) 150/89 (!) 155/81  (!) 168/97  Pulse:  63 62 66  Resp:  (!) 8 (!) 9 11  Temp: 98.7 F (37.1 C)     TempSrc: Oral     SpO2:  99% 97% 100%  Weight:      Height:       Physical Exam Vitals and nursing note reviewed.  Constitutional:      General: He is not in acute distress. HENT:     Head: Normocephalic and atraumatic.  Cardiovascular:     Rate and Rhythm: Normal rate and regular rhythm.     Heart sounds: Normal heart sounds.  Pulmonary:     Effort: Pulmonary effort is normal.     Breath sounds: Normal breath sounds.  Abdominal:     Palpations: Abdomen is soft.     Tenderness: There is no abdominal tenderness.  Neurological:     Mental Status: Mental status is at baseline.     Labs on Admission: I have personally reviewed following labs and imaging studies  CBC: Recent Labs  Lab 07/27/24 1637 07/29/24 1702  WBC 13.1* 13.8*  HGB 16.0 17.7*  HCT 48.9 51.0  MCV 96.1 90.7  PLT 209 287   Basic Metabolic Panel: Recent Labs  Lab 07/27/24 1637 07/27/24 2021 07/29/24 1702  NA 140 141 136  K 3.8 3.8 3.5  CL 106 109 99  CO2 16* 20* 20*  GLUCOSE 202* 145* 138*  BUN 20 18 24*  CREATININE 1.37* 1.24 1.36*  CALCIUM  10.7* 9.1 10.0   GFR: Estimated Creatinine Clearance: 66.1 mL/min (A) (by C-G formula based on SCr of 1.36 mg/dL (H)). Liver Function Tests: Recent Labs  Lab 07/27/24 1637  AST 37  ALT 18  ALKPHOS 101  BILITOT 1.1  PROT 9.3*  ALBUMIN 5.3*   Recent Labs  Lab 07/27/24 1637  LIPASE 25   No results for input(s): AMMONIA in the last 168 hours. Coagulation Profile: No results for input(s): INR, PROTIME in the last 168 hours. Cardiac Enzymes: No results for input(s): CKTOTAL, CKMB, CKMBINDEX, TROPONINI in the last 168 hours. BNP (last 3 results) No results for input(s): PROBNP in the last 8760 hours. HbA1C: No results for input(s): HGBA1C in the last 72 hours. CBG: No results for input(s): GLUCAP in the last 168 hours. Lipid Profile: No  results for input(s): CHOL, HDL, LDLCALC, TRIG, CHOLHDL, LDLDIRECT in the last 72 hours. Thyroid Function Tests: No results for input(s): TSH, T4TOTAL, FREET4, T3FREE, THYROIDAB in the last 72 hours. Anemia Panel: No results for input(s): VITAMINB12, FOLATE, FERRITIN, TIBC, IRON, RETICCTPCT in the last 72 hours. Urine analysis:    Component Value Date/Time   COLORURINE AMBER (A) 09/15/2023 1311   APPEARANCEUR HAZY (A) 09/15/2023 1311   APPEARANCEUR Clear 05/09/2014 1146   LABSPEC 1.030 09/15/2023 1311   LABSPEC 1.056 05/09/2014 1146   PHURINE 5.0 09/15/2023 1311   GLUCOSEU NEGATIVE 09/15/2023 1311   GLUCOSEU Negative 05/09/2014 1146   HGBUR SMALL (A) 09/15/2023 1311   BILIRUBINUR SMALL (A) 09/15/2023 1311   BILIRUBINUR Negative 05/09/2014 1146   KETONESUR NEGATIVE 09/15/2023 1311   PROTEINUR 30 (A) 09/15/2023 1311  UROBILINOGEN 0.2 05/28/2015 1152   NITRITE NEGATIVE 09/15/2023 1311   LEUKOCYTESUR NEGATIVE 09/15/2023 1311   LEUKOCYTESUR Negative 05/09/2014 1146    Radiological Exams on Admission: CT ABDOMEN PELVIS W CONTRAST Result Date: 07/29/2024 CLINICAL DATA:  Abdominal pain, nausea vomiting. EXAM: CT ABDOMEN AND PELVIS WITH CONTRAST TECHNIQUE: Multidetector CT imaging of the abdomen and pelvis was performed using the standard protocol following bolus administration of intravenous contrast. RADIATION DOSE REDUCTION: This exam was performed according to the departmental dose-optimization program which includes automated exposure control, adjustment of the mA and/or kV according to patient size and/or use of iterative reconstruction technique. CONTRAST:  OMNIPAQUE  IOHEXOL  300 MG/ML  SOLN COMPARISON:  CT dated 09/15/2023. FINDINGS: Lower chest: The visualized lung bases are clear. No intra-abdominal free air or free fluid. Hepatobiliary: The previously seen hypodense lesion in the right lobe of the liver is not well visualized on today's exam. No  biliary dilatation. The gallbladder is unremarkable. Pancreas: Unremarkable. No pancreatic ductal dilatation or surrounding inflammatory changes. Spleen: Normal in size without focal abnormality. Adrenals/Urinary Tract: The adrenal glands are unremarkable. There is no hydronephrosis on either side. There is symmetric enhancement and excretion of contrast by both kidneys. The visualized ureters and urinary bladder appear unremarkable. Stomach/Bowel: There is no bowel obstruction or active inflammation. The appendix is normal. Vascular/Lymphatic: Mild aortoiliac atherosclerotic disease. There is a 2.6 cm infrarenal aortic ectasia. The IVC is unremarkable. No portal venous gas. There is no adenopathy. Reproductive: The prostate and seminal vesicles are grossly remarkable. Other: None Musculoskeletal: No acute or significant osseous findings. IMPRESSION: 1. No acute intra-abdominal or pelvic pathology. 2.  Aortic Atherosclerosis (ICD10-I70.0). Electronically Signed   By: Vanetta Chou M.D.   On: 07/29/2024 18:48   DG Chest Port 1 View Result Date: 07/29/2024 CLINICAL DATA:  Vomiting, chest pain, dizziness EXAM: PORTABLE CHEST 1 VIEW COMPARISON:  07/27/2024 FINDINGS: 2 frontal views of the chest demonstrate an unremarkable cardiac silhouette. No acute airspace disease, effusion, or pneumothorax. No acute bony abnormalities. IMPRESSION: 1. No acute intrathoracic process. Electronically Signed   By: Ozell Daring M.D.   On: 07/29/2024 18:03   Data Reviewed for HPI: Relevant notes from primary care and specialist visits, past discharge summaries as available in EHR, including Care Everywhere. Prior diagnostic testing as pertinent to current admission diagnoses Updated medications and problem lists for reconciliation ED course, including vitals, labs, imaging, treatment and response to treatment Triage notes, nursing and pharmacy notes and ED provider's notes Notable results as noted above in  HPI      Assessment and Plan: * Intractable vomiting with nausea Esophageal ulcer and grade a esophagitis on EGD 09/2023 Elevated lactic acid Patient with intractable abdominal pain nausea, vomiting, poor tolerance of oral CT abdomen and pelvis nonacute Low suspicion for sepsis Will get procalcitonin and hold off on further antibiotic treatment IV Protonix  IV hydration, IV antiemetics, IV pain control N.p.o. except for ice chips for bowel rest Follow procalcitonin and continue to trend lactic acid GI consult to evaluate for need for repeat EGD  AKI (acute kidney injury) (HCC) Secondary to ATN from dehydration from intractable vomiting IV hydration Monitor renal function and avoid nephrotoxins    DVT prophylaxis: SCD  Consults: GI  Advance Care Planning:   Code Status: Prior   Family Communication: none  Disposition Plan: Back to previous home environment  Severity of Illness: The appropriate patient status for this patient is OBSERVATION. Observation status is judged to be reasonable and necessary in order  to provide the required intensity of service to ensure the patient's safety. The patient's presenting symptoms, physical exam findings, and initial radiographic and laboratory data in the context of their medical condition is felt to place them at decreased risk for further clinical deterioration. Furthermore, it is anticipated that the patient will be medically stable for discharge from the hospital within 2 midnights of admission.   Author: Delayne LULLA Solian, MD 07/29/2024 9:06 PM  For on call review [email or web address].

## 2024-07-29 NOTE — Assessment & Plan Note (Addendum)
 Esophageal ulcer and grade a esophagitis on EGD 09/2023 Elevated lactic acid Patient with intractable abdominal pain nausea, vomiting, poor tolerance of oral CT abdomen and pelvis nonacute Low suspicion for sepsis Will get procalcitonin and hold off on further antibiotic treatment IV Protonix  IV hydration, IV antiemetics, IV pain control N.p.o. except for ice chips for bowel rest Follow procalcitonin and continue to trend lactic acid GI consult to evaluate for need for repeat EGD

## 2024-07-29 NOTE — ED Provider Notes (Signed)
 [facility] Provider Note    Event Date/Time   First MD Initiated Contact with Patient 07/29/24 1655     (approximate)   History   Chest Pain and Emesis   HPI  Tyler Bennett is a 53 y.o. male who presents to the emergency department today because of concerns for continued chest and abdominal pain as well as nausea and vomiting.  Patient was seen in the emergency department 2 days ago.  He says since then his symptoms have persisted.  Has been taking medication without any significant relief.  He denies any fevers although has had chills.  He says he has not had a bowel movement in 2 days although is passing gas.  He denies any history of surgeries in his abdomen.     Physical Exam   Triage Vital Signs: ED Triage Vitals  Encounter Vitals Group     BP 07/29/24 1702 (!) 170/106     Girls Systolic BP Percentile --      Girls Diastolic BP Percentile --      Boys Systolic BP Percentile --      Boys Diastolic BP Percentile --      Pulse Rate 07/29/24 1702 84     Resp 07/29/24 1702 19     Temp 07/29/24 1702 (!) 97.4 F (36.3 C)     Temp Source 07/29/24 1702 Axillary     SpO2 07/29/24 1702 100 %     Weight 07/29/24 1658 185 lb 3 oz (84 kg)     Height 07/29/24 1720 5' 8 (1.727 m)     Head Circumference --      Peak Flow --      Pain Score 07/29/24 1658 5     Pain Loc --      Pain Education --      Exclude from Growth Chart --     Most recent vital signs: Vitals:   07/29/24 1702 07/29/24 1730  BP: (!) 170/106 (!) 155/90  Pulse: 84 (!) 55  Resp: 19 10  Temp: (!) 97.4 F (36.3 C)   SpO2: 100% 100%   General: Awake, alert, oriented. CV:  Good peripheral perfusion. Regular rate and rhythm. Resp:  Normal effort. Lungs clear. Abd:  No distention. Tender to palpation.   ED Results / Procedures / Treatments   Labs (all labs ordered are listed, but only abnormal results are displayed) Labs Reviewed  CBC - Abnormal; Notable for the following  components:      Result Value   WBC 13.8 (*)    Hemoglobin 17.7 (*)    All other components within normal limits  LACTIC ACID, PLASMA - Abnormal; Notable for the following components:   Lactic Acid, Venous 3.0 (*)    All other components within normal limits  CULTURE, BLOOD (ROUTINE X 2)  CULTURE, BLOOD (ROUTINE X 2)  BASIC METABOLIC PANEL WITH GFR  LACTIC ACID, PLASMA  TROPONIN I (HIGH SENSITIVITY)     EKG  I, Guadalupe Eagles, attending physician, personally viewed and interpreted this EKG  EKG Time: 1700 Rate: 85 Rhythm: sinus rhythm Axis: normal Intervals: qtc 511 QRS: narrow, LVH, q waves III ST changes: no st elevation Impression: abnormal ekg   RADIOLOGY I independently interpreted and visualized the CXR. My interpretation: No pneumonia Radiology interpretation:  IMPRESSION:  1. No acute intrathoracic process.    I independently interpreted and visualized the CT abd/pel. My interpretation: No free air Radiology interpretation: IMPRESSION:  1. No acute  intra-abdominal or pelvic pathology.  2.  Aortic Atherosclerosis (ICD10-I70.0).    PROCEDURES:  Critical Care performed: Yes  CRITICAL CARE Performed by: Guadalupe Eagles   Total critical care time: 30 minutes  Critical care time was exclusive of separately billable procedures and treating other patients.  Critical care was necessary to treat or prevent imminent or life-threatening deterioration.  Critical care was time spent personally by me on the following activities: development of treatment plan with patient and/or surrogate as well as nursing, discussions with consultants, evaluation of patient's response to treatment, examination of patient, obtaining history from patient or surrogate, ordering and performing treatments and interventions, ordering and review of laboratory studies, ordering and review of radiographic studies, pulse oximetry and re-evaluation of patient's condition.    Procedures    MEDICATIONS ORDERED IN ED: Medications  lactated ringers  infusion (has no administration in time range)  lactated ringers  bolus 1,000 mL (has no administration in time range)    And  lactated ringers  bolus 1,000 mL (has no administration in time range)    And  lactated ringers  bolus 1,000 mL (has no administration in time range)  ceFEPIme  (MAXIPIME ) 2 g in sodium chloride  0.9 % 100 mL IVPB (has no administration in time range)  metroNIDAZOLE  (FLAGYL ) IVPB 500 mg (has no administration in time range)  vancomycin  (VANCOCIN ) IVPB 1000 mg/200 mL premix (has no administration in time range)     IMPRESSION / MDM / ASSESSMENT AND PLAN / ED COURSE  I reviewed the triage vital signs and the nursing notes.                              Differential diagnosis includes, but is not limited to, intraabdominal infection, SBO, ulcer disease  Patient's presentation is most consistent with acute presentation with potential threat to life or bodily function.   The patient is on the cardiac monitor to evaluate for evidence of arrhythmia and/or significant heart rate changes.  Patient present to the emergency department today because of concerns for continued abdominal pain, nausea, decreased appetite with some chest pain.  On exam patient was afebrile.  Blood work was notable for significant lactic acidosis.  Additionally patient had elevated white count.  Given initial concern for sepsis broad-spectrum antibiotics were ordered.  Patient was given IV fluids.  Patient did feel improvement after fluids and medication.  Repeat lactic shows improvement although still elevated.  Discussed with Dr. Cleatus with hospitalist service who will evaluate for admission.      FINAL CLINICAL IMPRESSION(S) / ED DIAGNOSES   Final diagnoses:  Nausea and vomiting, unspecified vomiting type        Rx / DC Orders   ED Discharge Orders     None        Note:  This document was prepared  using Dragon voice recognition software and may include unintentional dictation errors.    Eagles Guadalupe, MD 07/29/24 2111

## 2024-07-30 DIAGNOSIS — R112 Nausea with vomiting, unspecified: Secondary | ICD-10-CM | POA: Diagnosis not present

## 2024-07-30 LAB — BASIC METABOLIC PANEL WITH GFR
Anion gap: 7 (ref 5–15)
BUN: 17 mg/dL (ref 6–20)
CO2: 24 mmol/L (ref 22–32)
Calcium: 8.3 mg/dL — ABNORMAL LOW (ref 8.9–10.3)
Chloride: 106 mmol/L (ref 98–111)
Creatinine, Ser: 0.94 mg/dL (ref 0.61–1.24)
GFR, Estimated: >60 mL/min (ref >60)
Glucose, Bld: 117 mg/dL — ABNORMAL HIGH (ref 70–99)
Potassium: 3.3 mmol/L — ABNORMAL LOW (ref 3.5–5.1)
Sodium: 137 mmol/L (ref 135–145)

## 2024-07-30 LAB — CBC
HCT: 38.8 % — ABNORMAL LOW (ref 39.0–52.0)
Hemoglobin: 13.3 g/dL (ref 13.0–17.0)
MCH: 31.4 pg (ref 26.0–34.0)
MCHC: 34.3 g/dL (ref 30.0–36.0)
MCV: 91.5 fL (ref 80.0–100.0)
Platelets: 208 K/uL (ref 150–400)
RBC: 4.24 MIL/uL (ref 4.22–5.81)
RDW: 13.5 % (ref 11.5–15.5)
WBC: 9.2 K/uL (ref 4.0–10.5)
nRBC: 0 % (ref 0.0–0.2)

## 2024-07-30 LAB — URINE DRUG SCREEN, QUALITATIVE (ARMC ONLY)
Amphetamines, Ur Screen: NOT DETECTED
Barbiturates, Ur Screen: NOT DETECTED
Benzodiazepine, Ur Scrn: NOT DETECTED
Cannabinoid 50 Ng, Ur ~~LOC~~: POSITIVE — AB
Cocaine Metabolite,Ur ~~LOC~~: NOT DETECTED
MDMA (Ecstasy)Ur Screen: NOT DETECTED
Methadone Scn, Ur: NOT DETECTED
Opiate, Ur Screen: POSITIVE — AB
Phencyclidine (PCP) Ur S: NOT DETECTED
Tricyclic, Ur Screen: NOT DETECTED

## 2024-07-30 LAB — TROPONIN I (HIGH SENSITIVITY): Troponin I (High Sensitivity): 6 ng/L (ref <18)

## 2024-07-30 LAB — HIV ANTIBODY (ROUTINE TESTING W REFLEX): HIV Screen 4th Generation wRfx: NONREACTIVE

## 2024-07-30 LAB — GLUCOSE, CAPILLARY: Glucose-Capillary: 99 mg/dL (ref 70–99)

## 2024-07-30 MED ORDER — POTASSIUM CHLORIDE 10 MEQ/100ML IV SOLN
10.0000 meq | INTRAVENOUS | Status: AC
  Administered 2024-07-30 (×3): 10 meq via INTRAVENOUS
  Filled 2024-07-30 (×2): qty 100

## 2024-07-30 MED ORDER — IPRATROPIUM-ALBUTEROL 0.5-2.5 (3) MG/3ML IN SOLN
3.0000 mL | Freq: Four times a day (QID) | RESPIRATORY_TRACT | Status: DC | PRN
  Administered 2024-07-30: 3 mL via RESPIRATORY_TRACT
  Filled 2024-07-30: qty 3

## 2024-07-30 MED ORDER — PANTOPRAZOLE SODIUM 40 MG IV SOLR
40.0000 mg | Freq: Two times a day (BID) | INTRAVENOUS | Status: DC
  Administered 2024-07-30 – 2024-07-31 (×3): 40 mg via INTRAVENOUS
  Filled 2024-07-30 (×3): qty 10

## 2024-07-30 MED ORDER — NITROGLYCERIN 0.4 MG SL SUBL: 0.4000 mg | SUBLINGUAL_TABLET | SUBLINGUAL | Status: DC | PRN

## 2024-07-30 MED ORDER — LACTATED RINGERS IV SOLN: INTRAVENOUS | Status: DC

## 2024-07-30 MED ORDER — PROCHLORPERAZINE EDISYLATE 10 MG/2ML IJ SOLN
5.0000 mg | Freq: Four times a day (QID) | INTRAMUSCULAR | Status: DC | PRN
  Administered 2024-07-30: 5 mg via INTRAVENOUS
  Filled 2024-07-30 (×2): qty 1

## 2024-07-30 MED ORDER — NITROGLYCERIN 0.4 MG SL SUBL
SUBLINGUAL_TABLET | SUBLINGUAL | Status: AC
  Administered 2024-07-30: 0.4 mg via SUBLINGUAL
  Filled 2024-07-30: qty 1

## 2024-07-30 MED ORDER — ENOXAPARIN SODIUM 40 MG/0.4ML IJ SOSY
40.0000 mg | PREFILLED_SYRINGE | INTRAMUSCULAR | Status: DC
  Administered 2024-07-30: 40 mg via SUBCUTANEOUS
  Filled 2024-07-30: qty 0.4

## 2024-07-30 NOTE — Significant Event (Signed)
 Rapid Response Event Note   Reason for Call : c/o chest/epigastric pain   Initial Focused Assessment: laying in bed, alert and oriented x 4, VSS, c/o chest/epigastric pain. Hx ulcers.      Interventions: Dr Awanda to bedside, stat EKG, trop, nitroglycerin  sublingual, and iv morphine . Gi consult placed.   Plan of Care: as above, [name], LPN to call for further assistance.    Event Summary: as above  MD Notified: Awanda [phone] Call [phone] Arrival Upfz:[id number] End Time:0900  Tyler Emert A, RN

## 2024-07-30 NOTE — Progress Notes (Signed)
   07/30/24 0845  Spiritual Encounters  Type of Visit Attempt (pt unavailable)  Care provided to: Pt not available  Conversation partners present during encounter Nurse  Referral source Code page (RRT)  Reason for visit Code  OnCall Visit Yes   Chaplain responded to RRT Code page.  Staff was assisting patient to stabilize him and no family was present.  Chaplain will follow-up as needed/requested by patient and/or staff.      Rev. Rana M. Nicholaus, M.Div. Chaplain Resident [facility]

## 2024-07-30 NOTE — Progress Notes (Signed)
  PROGRESS NOTE    Tyler Bennett  FMW:[id number] DOB: 08/17/1971 DOA: 07/29/2024 PCP: Pcp, No  131A/131A-AA  LOS: 0 days   Brief hospital course:   Assessment & Plan: Tyler Bennett is a 53 y.o. male with medical history significant for Esophageal ulcer on EGD 09/2023 following a hospitalization for intractable vomiting, of which she has had several, who returns to the ED for the second time in 3 days with intractable nausea, vomiting and abdominal pain similar to prior episodes.    * Intractable vomiting with nausea --UDS pos for cannabinoid.  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 --anti-emetics --clear liquid diet  Chest pain, ACS ruled out Hx of Esophageal ulcer and grade a esophagitis on EGD 09/2023 --unclear if pt was taking his home PPI --GI consulted, Repeat upper endoscopy will not change the management hence GI does not recommend it at this point of time.  --cont IV PPI BID  Elevated lactic acid --resolved with IVF  AKI (acute kidney injury) (HCC) --improved with IVF --cont MIVF  Hypokalemia --monitor and supplement with IV PRN   DVT prophylaxis: Lovenox  SQ Code Status: Full code  Family Communication:  Level of care: Med-Surg Dispo:   The patient is from: home Anticipated d/c is to: home Anticipated d/c date is: 1-2 days   Subjective and Interval History:  Rapid response called this morning for chest pain.  EKG wnl, trop neg.  Pt has known esophageal ulcer.    Also having nausea.   UDS pos for cannabinoid.    Objective: Vitals:   07/30/24 0722 07/30/24 0850 07/30/24 0901 07/30/24 1554  BP: 112/83 (!) 149/91 114/82 136/72  Pulse: (!) 47  (!) 58 (!) 54  Resp: 17   19  Temp: 98.2 F (36.8 C)   98.2 F (36.8 C)  TempSrc: Oral     SpO2: 99%  98% 100%  Weight:      Height:        Intake/Output Summary (Last 24 hours) at 07/30/2024 1838 Last data filed at  07/30/2024 1635 Gross per 24 hour  Intake 533.12 ml  Output 800 ml  Net -266.88 ml   Filed Weights   07/29/24 1658 07/29/24 1720  Weight: 84 kg 85.7 kg    Examination:   Constitutional: AAOx3 HEENT: conjunctivae and lids normal, EOMI CV: No cyanosis.   RESP: normal respiratory effort, on RA   Data Reviewed: I have personally reviewed labs and imaging studies  Time spent: 50 minutes  Ellouise Haber, MD [facility] If 7PM-7AM, please contact night-coverage 07/30/2024, 6:38 PM

## 2024-07-30 NOTE — Plan of Care (Signed)

## 2024-07-30 NOTE — Progress Notes (Signed)
 Patient complained of chest pain, stating it felt like pressure and someone sitting on his chest. Patient rated pain a 4/10. MD was notified. Rapid response was initiated. EKG was performed. MD ordered for morphine  IV and nitroglycerin  SL to be administered. Awaiting troponin to be checked.

## 2024-07-30 NOTE — Plan of Care (Signed)
   Problem: Clinical Measurements: Goal: Ability to maintain clinical measurements within normal limits will improve Outcome: Progressing

## 2024-07-30 NOTE — Consult Note (Signed)
 Tyler Bennett , MD [address], [address], [place], [place], [address] Phone: [phone] Fax: [phone]  Consultation  Referring Provider:    Dr cleatus Primary Care Physician:  Pcp, No Primary Gastroenterologist: [facility]         Reason for Consultation:     Nausea vomiting  Date of Admission:  07/29/2024 Date of Consultation:  07/30/2024         HPI:   Tyler Bennett is a 53 y.o. 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  Subsequently was seen by Ellouise Console at the office in October 2024 and he was having some issues with dysphagia and also had intermittent nausea vomiting, chronic marijuana use and subsequently underwent an EGD and colonoscopyIn November 2027 performed by Dr. Jinny, the upper endoscopy demonstrated LA grade a esophagitis inflammation in the gastric antrum gastritis  The colonoscopy that was also performed on the same day showed 3 subcentimeter polyps that were resected and internal hemorrhoids.   .  On this admission the patient was admitted with intractable vomiting, presented to the hospital a few days back with AKI lactic acidosis was dehydrated and discharged.  He says that the nausea vomiting began on Wednesday all of a sudden when he was at work.  Denies any diarrhea.  The vomitus was nonbloody.  He stopped smoking marijuana 2 weeks back.  Denies any passive smoking.  Since coming to the hospital he still has nausea and vomits when he tries to take any liquids or fluids but the abdominal pain that he had when he came in has resolved.  Does not use any NSAIDs.  Denies any headaches or visual symptoms.  CT scans abdomen pelvis shows no acute findings in particular shows no bowel obstruction or active inflammation..  He has AKI and being rehydrated I have been consulted to evaluate for repeat  EGD.   Labs this morning hemoglobin 13.3 g MCV 91.5.  Creatinine has improved from 1.45-0.94 potassium is 3.3.  No urine toxicology has been performed.   Past Medical History:  Diagnosis Date   Epigastric pain     Past Surgical History:  Procedure Laterality Date   BIOPSY  10/13/2023   Procedure: BIOPSY;  Surgeon: Jinny Carmine, MD;  Location: [facility] ENDOSCOPY;  Service: Endoscopy;;   CARDIAC CATHETERIZATION N/A 05/24/2015   Procedure: Left Heart Cath and Coronary Angiography;  Surgeon: Ozell Fell, MD;  Location: [facility] INVASIVE CV LAB;  Service: Cardiovascular;  Laterality: N/A;   COLONOSCOPY WITH PROPOFOL  N/A 10/13/2023   Procedure: COLONOSCOPY WITH PROPOFOL ;  Surgeon: Jinny Carmine, MD;  Location: [facility] ENDOSCOPY;  Service: Endoscopy;  Laterality: N/A;   ESOPHAGOGASTRODUODENOSCOPY (EGD) WITH PROPOFOL  N/A 10/13/2023   Procedure: ESOPHAGOGASTRODUODENOSCOPY (EGD) WITH PROPOFOL ;  Surgeon: Jinny Carmine, MD;  Location: [facility] ENDOSCOPY;  Service: Endoscopy;  Laterality: N/A;    Prior to Admission medications   Medication Sig Start Date End Date Taking? Authorizing Provider  HYDROcodone -acetaminophen  (NORCO/VICODIN) 5-325 MG tablet Take 1 tablet by mouth every 4 (four) hours as needed. 07/27/24  Yes Dorothyann Drivers, MD  ondansetron  (ZOFRAN -ODT) 4 MG disintegrating tablet Take 1 tablet (4 mg total) by mouth every 8 (eight) hours as needed for nausea or vomiting. 07/27/24  Yes Dorothyann Drivers, MD  pantoprazole  (PROTONIX ) 40 MG tablet Take 1 tablet (40 mg total) by mouth daily. 10/13/23  Yes Wohl,  Darren, MD    Family History  Problem Relation Age of Onset   Diabetes type II Mother    Heart failure Mother    Hyperlipidemia Father    CAD Other      Social History   Tobacco Use   Smoking status: Every Day    Types: Cigarettes  Vaping Use   Vaping status: Some Days  Substance Use Topics   Alcohol use: Yes    Alcohol/week: 1.0 standard drink of alcohol    Types: 1 Shots of liquor per  week   Drug use: Yes    Types: Marijuana    Allergies as of 07/29/2024   (No Known Allergies)    Review of Systems:    All systems reviewed and negative except where noted in HPI.   Physical Exam:  Vital signs in last 24 hours: Temp:  [97.4 F (36.3 C)-98.7 F (37.1 C)] 98.2 F (36.8 C) (09/14 0722) Pulse Rate:  [47-84] 58 (09/14 0901) Resp:  [8-19] 17 (09/14 0722) BP: (112-170)/(56-106) 114/82 (09/14 0901) SpO2:  [92 %-100 %] 98 % (09/14 0901) Weight:  [84 kg-85.7 kg] 85.7 kg (09/13 1720)   General:   Pleasant, cooperative in NAD Head:  Normocephalic and atraumatic. Eyes:   No icterus.   Conjunctiva pink. PERRLA. Ears:  Normal auditory acuity. Neck:  Supple; no masses or thyroidomegaly Lungs: Respirations even and unlabored. Lungs clear to auscultation bilaterally.   No wheezes, crackles, or rhonchi.  Heart:  Regular rate and rhythm;  Without murmur, clicks, rubs or gallops Abdomen:  Soft, nondistended, nontender. Normal bowel sounds. No appreciable masses or hepatomegaly.  No rebound or guarding.  Neurologic:  Alert and oriented x3;  grossly normal neurologically. Skin:  Intact without significant lesions or rashes. Cervical Nodes:  No significant cervical adenopathy. Psych:  Alert and cooperative. Normal affect.  LAB RESULTS: Recent Labs    07/27/24 1637 07/29/24 1702 07/30/24 0310  WBC 13.1* 13.8* 9.2  HGB 16.0 17.7* 13.3  HCT 48.9 51.0 38.8*  PLT 209 287 208   BMET Recent Labs    07/29/24 1702 07/29/24 1852 07/30/24 0310  NA 136 134* 137  K 3.5 3.6 3.3*  CL 99 100 106  CO2 20* 24 24  GLUCOSE 138* 116* 117*  BUN 24* 24* 17  CREATININE 1.36* 1.45* 0.94  CALCIUM  10.0 8.8* 8.3*   LFT Recent Labs    07/27/24 1637  PROT 9.3*  ALBUMIN 5.3*  AST 37  ALT 18  ALKPHOS 101  BILITOT 1.1   PT/INR No results for input(s): LABPROT, INR in the last 72 hours.  STUDIES: CT ABDOMEN PELVIS W CONTRAST Result Date: 07/29/2024 CLINICAL DATA:  Abdominal  pain, nausea vomiting. EXAM: CT ABDOMEN AND PELVIS WITH CONTRAST TECHNIQUE: Multidetector CT imaging of the abdomen and pelvis was performed using the standard protocol following bolus administration of intravenous contrast. RADIATION DOSE REDUCTION: This exam was performed according to the departmental dose-optimization program which includes automated exposure control, adjustment of the mA and/or kV according to patient size and/or use of iterative reconstruction technique. CONTRAST:  OMNIPAQUE  IOHEXOL  300 MG/ML  SOLN COMPARISON:  CT dated 09/15/2023. FINDINGS: Lower chest: The visualized lung bases are clear. No intra-abdominal free air or free fluid. Hepatobiliary: The previously seen hypodense lesion in the right lobe of the liver is not well visualized on today's exam. No biliary dilatation. The gallbladder is unremarkable. Pancreas: Unremarkable. No pancreatic ductal dilatation or surrounding inflammatory changes. Spleen: Normal in size without focal abnormality.  Adrenals/Urinary Tract: The adrenal glands are unremarkable. There is no hydronephrosis on either side. There is symmetric enhancement and excretion of contrast by both kidneys. The visualized ureters and urinary bladder appear unremarkable. Stomach/Bowel: There is no bowel obstruction or active inflammation. The appendix is normal. Vascular/Lymphatic: Mild aortoiliac atherosclerotic disease. There is a 2.6 cm infrarenal aortic ectasia. The IVC is unremarkable. No portal venous gas. There is no adenopathy. Reproductive: The prostate and seminal vesicles are grossly remarkable. Other: None Musculoskeletal: No acute or significant osseous findings. IMPRESSION: 1. No acute intra-abdominal or pelvic pathology. 2.  Aortic Atherosclerosis (ICD10-I70.0). Electronically Signed   By: Vanetta Chou M.D.   On: 07/29/2024 18:48   DG Chest Port 1 View Result Date: 07/29/2024 CLINICAL DATA:  Vomiting, chest pain, dizziness EXAM: PORTABLE CHEST 1 VIEW  COMPARISON:  07/27/2024 FINDINGS: 2 frontal views of the chest demonstrate an unremarkable cardiac silhouette. No acute airspace disease, effusion, or pneumothorax. No acute bony abnormalities. IMPRESSION: 1. No acute intrathoracic process. Electronically Signed   By: Ozell Daring M.D.   On: 07/29/2024 18:03      Impression / Plan:   Tyler Bennett is a 53 y.o. y/o male who has a prior history of intractable nausea vomiting possible gastroparesis admitted in October 2024 with similar complaints treated with Reglan  upper endoscopy in November showed grade a esophagitis but no obstruction.  There was concern for marijuana use in the past.  Admitted to the hospital on this admission with 2 presentations to the ER with nausea vomiting AKI.  CT scan of the abdomen shows no bowel blockage no evidence of active inflammation.  I have been consulted to consider repeat upper endoscopy.  My impression is that the patient possibly has cannabinoid hyperemesis syndrome related to marijuana use or possible cyclical vomiting syndrome.  Repeat upper endoscopy will not change the management hence I do not recommend it at this point of time as there is no dysphagia or abdominal pain..  If it is normal we still have to treat him for his intractable nausea vomiting with antiemetics and PPI, if it were to show an abnormality such as esophagitis it still would not change our management as we still would have to treat him for nausea vomiting and continue his PPI as we already know he has had esophagitis in the past.    The main cornerstone of treatment would be to avoid any marijuana use.  Obviously other thoughts that would need to be entertained if this does not resolve would be any central intracranial process which would require imaging such as an MRI if does not respond to conservative management in 24 to 48 hours.  The patient will be followed by Dr. Jinny who is on from tomorrow.  Thank you for involving me in the care  of this patient.      LOS: 0 days   Tyler Kung, MD  07/30/2024, 11:05 AM

## 2024-07-31 ENCOUNTER — Other Ambulatory Visit: Payer: Self-pay

## 2024-07-31 DIAGNOSIS — R112 Nausea with vomiting, unspecified: Secondary | ICD-10-CM | POA: Diagnosis not present

## 2024-07-31 LAB — MAGNESIUM: Magnesium: 1.9 mg/dL (ref 1.7–2.4)

## 2024-07-31 LAB — BASIC METABOLIC PANEL WITH GFR
Anion gap: 9 (ref 5–15)
BUN: 12 mg/dL (ref 6–20)
CO2: 25 mmol/L (ref 22–32)
Calcium: 8.5 mg/dL — ABNORMAL LOW (ref 8.9–10.3)
Chloride: 104 mmol/L (ref 98–111)
Creatinine, Ser: 0.88 mg/dL (ref 0.61–1.24)
GFR, Estimated: >60 mL/min (ref >60)
Glucose, Bld: 102 mg/dL — ABNORMAL HIGH (ref 70–99)
Potassium: 3.3 mmol/L — ABNORMAL LOW (ref 3.5–5.1)
Sodium: 138 mmol/L (ref 135–145)

## 2024-07-31 MED ORDER — POTASSIUM CHLORIDE 10 MEQ/100ML IV SOLN: 10.0000 meq | INTRAVENOUS | Status: DC

## 2024-07-31 MED ORDER — PANTOPRAZOLE SODIUM 40 MG PO TBEC
40.0000 mg | DELAYED_RELEASE_TABLET | Freq: Two times a day (BID) | ORAL | 0 refills | Status: AC
Start: 2024-07-31 — End: 2024-10-29
  Filled 2024-07-31: qty 180, 90d supply, fill #0

## 2024-07-31 NOTE — Progress Notes (Signed)
 Discharge criteria met

## 2024-07-31 NOTE — Plan of Care (Signed)

## 2024-07-31 NOTE — Discharge Summary (Signed)
 Physician Discharge Summary   Tyler Bennett  male DOB: Mar 28, 1971  FMW:[id number]  PCP: Pcp, No  Admit date: 07/29/2024 Discharge date: 07/31/2024  Admitted From: home Disposition:  home CODE STATUS: Full code  Discharge Instructions     Discharge instructions   Complete by: As directed    Your nausea vomiting may be due to marijuana use.  Try stopping marijuana use.  Your chest pain is likely from your known esophageal ulcer.  I have filled your protonix  40 mg twice daily for acid reduction. [facility] Course:  For full details, please see H&P, progress notes, consult notes and ancillary notes.  Briefly,  Tyler Bennett is a 53 y.o. male with medical history significant for Esophageal ulcer on EGD 09/2023 following a hospitalization for intractable vomiting, of which pt has had several, who returned to the ED for the second time in 3 days with intractable nausea, vomiting and abdominal pain similar to prior episodes.    * Intractable vomiting with nausea --UDS pos for cannabinoid.  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 --anti-emetics --symptoms resolved and pt was tolerating diet prior to discharge.   Chest pain, ACS ruled out --trop neg x3, EKG neg for ACS-related changes.  Pain was located in mid central chest, likely esophageal in etiology given hx of esophageal ulcer.  Pt received IV PPI and chest pain resolved prior to discharge.  Hx of Esophageal ulcer and grade a esophagitis on EGD 09/2023 --unclear if pt was taking his home PPI --GI consulted, Repeat upper endoscopy will not change the management hence GI does not recommend it at this point of time.  --received IV PPI BID during hospitalization.  Discharged on protonix  40 mg BID, 90 days filled and med brought to pt prior to discharge.   Elevated lactic acid --resolved with IVF   AKI (acute kidney injury)  (HCC) --Cr 1.36 on presentation.  Improved with IVF.  Cr 0.88 prior to discharge.   Hypokalemia --monitored and supplemented with IV PRN   Discharge Diagnoses:  Principal Problem:   Intractable vomiting with nausea Active Problems:   AKI (acute kidney injury) (HCC)   Peptic ulcer disease   30 Day Unplanned Readmission Risk Score    Flowsheet Row ED to Hosp-Admission (Current) from 07/29/2024 in [facility] (1A)  30 Day Unplanned Readmission Risk Score (%) 12.81 Filed at 07/31/2024 1200    This score is the patient's risk of an unplanned readmission within 30 days of being discharged (0 -100%). The score is based on dignosis, age, lab data, medications, orders, and past utilization.   Low:  0-14.9   Medium: 15-21.9   High: 22-29.9   Extreme: 30 and above         Discharge Instructions:  Allergies as of 07/31/2024   No Known Allergies      Medication List     TAKE these medications    HYDROcodone -acetaminophen  5-325 MG tablet Commonly known as: NORCO/VICODIN Take 1 tablet by mouth every 4 (four) hours as needed.   ondansetron  4 MG disintegrating tablet Commonly known as: ZOFRAN -ODT Take 1 tablet (4 mg total) by mouth every 8 (eight) hours as needed for nausea or vomiting.   pantoprazole  40 MG tablet Commonly known as: PROTONIX  Take 1 tablet (40 mg total) by mouth 2 (two) times daily. What changed: when to take this  Follow-up Information     Your PCP Follow up in 1 week(s).                  No Known Allergies   The results of significant diagnostics from this hospitalization (including imaging, microbiology, ancillary and laboratory) are listed below for reference.   Consultations:   Procedures/Studies: CT ABDOMEN PELVIS W CONTRAST Result Date: 07/29/2024 CLINICAL DATA:  Abdominal pain, nausea vomiting. EXAM: CT ABDOMEN AND PELVIS WITH CONTRAST TECHNIQUE: Multidetector CT imaging of the abdomen and  pelvis was performed using the standard protocol following bolus administration of intravenous contrast. RADIATION DOSE REDUCTION: This exam was performed according to the departmental dose-optimization program which includes automated exposure control, adjustment of the mA and/or kV according to patient size and/or use of iterative reconstruction technique. CONTRAST:  OMNIPAQUE  IOHEXOL  300 MG/ML  SOLN COMPARISON:  CT dated 09/15/2023. FINDINGS: Lower chest: The visualized lung bases are clear. No intra-abdominal free air or free fluid. Hepatobiliary: The previously seen hypodense lesion in the right lobe of the liver is not well visualized on today's exam. No biliary dilatation. The gallbladder is unremarkable. Pancreas: Unremarkable. No pancreatic ductal dilatation or surrounding inflammatory changes. Spleen: Normal in size without focal abnormality. Adrenals/Urinary Tract: The adrenal glands are unremarkable. There is no hydronephrosis on either side. There is symmetric enhancement and excretion of contrast by both kidneys. The visualized ureters and urinary bladder appear unremarkable. Stomach/Bowel: There is no bowel obstruction or active inflammation. The appendix is normal. Vascular/Lymphatic: Mild aortoiliac atherosclerotic disease. There is a 2.6 cm infrarenal aortic ectasia. The IVC is unremarkable. No portal venous gas. There is no adenopathy. Reproductive: The prostate and seminal vesicles are grossly remarkable. Other: None Musculoskeletal: No acute or significant osseous findings. IMPRESSION: 1. No acute intra-abdominal or pelvic pathology. 2.  Aortic Atherosclerosis (ICD10-I70.0). Electronically Signed   By: Vanetta Chou M.D.   On: 07/29/2024 18:48   DG Chest Port 1 View Result Date: 07/29/2024 CLINICAL DATA:  Vomiting, chest pain, dizziness EXAM: PORTABLE CHEST 1 VIEW COMPARISON:  07/27/2024 FINDINGS: 2 frontal views of the chest demonstrate an unremarkable cardiac silhouette. No acute  airspace disease, effusion, or pneumothorax. No acute bony abnormalities. IMPRESSION: 1. No acute intrathoracic process. Electronically Signed   By: Ozell Daring M.D.   On: 07/29/2024 18:03   DG Chest 2 View Result Date: 07/27/2024 CLINICAL DATA:  Shortness of breath since yesterday. Chest pain today. EXAM: CHEST - 2 VIEW COMPARISON:  Radiographs 09/09/2023 and 11/21/2022.  CT 05/25/2022. FINDINGS: The frontal examination was repeated. The heart size and mediastinal contours are normal. The lungs are clear. There is no pleural effusion or pneumothorax. No acute osseous findings are identified. Old right clavicle fracture and mild spondylosis noted. IMPRESSION: No evidence of active cardiopulmonary process. Electronically Signed   By: Elsie Perone M.D.   On: 07/27/2024 17:08      Labs: BNP (last 3 results) No results for input(s): BNP in the last 8760 hours. Basic Metabolic Panel: Recent Labs  Lab 07/27/24 2021 07/29/24 1702 07/29/24 1852 07/30/24 0310 07/31/24 0345  NA 141 136 134* 137 138  K 3.8 3.5 3.6 3.3* 3.3*  CL 109 99 100 106 104  CO2 20* 20* 24 24 25   GLUCOSE 145* 138* 116* 117* 102*  BUN 18 24* 24* 17 12  CREATININE 1.24 1.36* 1.45* 0.94 0.88  CALCIUM  9.1 10.0 8.8* 8.3* 8.5*  MG  --   --   --   --  1.9  Liver Function Tests: Recent Labs  Lab 07/27/24 1637  AST 37  ALT 18  ALKPHOS 101  BILITOT 1.1  PROT 9.3*  ALBUMIN 5.3*   Recent Labs  Lab 07/27/24 1637  LIPASE 25   No results for input(s): AMMONIA in the last 168 hours. CBC: Recent Labs  Lab 07/27/24 1637 07/29/24 1702 07/30/24 0310  WBC 13.1* 13.8* 9.2  HGB 16.0 17.7* 13.3  HCT 48.9 51.0 38.8*  MCV 96.1 90.7 91.5  PLT 209 287 208   Cardiac Enzymes: No results for input(s): CKTOTAL, CKMB, CKMBINDEX, TROPONINI in the last 168 hours. BNP: Invalid input(s): POCBNP CBG: Recent Labs  Lab 07/30/24 0850  GLUCAP 99   D-Dimer No results for input(s): DDIMER in the last 72  hours. Hgb A1c No results for input(s): HGBA1C in the last 72 hours. Lipid Profile No results for input(s): CHOL, HDL, LDLCALC, TRIG, CHOLHDL, LDLDIRECT in the last 72 hours. Thyroid function studies No results for input(s): TSH, T4TOTAL, T3FREE, THYROIDAB in the last 72 hours.  Invalid input(s): FREET3 Anemia work up No results for input(s): VITAMINB12, FOLATE, FERRITIN, TIBC, IRON, RETICCTPCT in the last 72 hours. Urinalysis    Component Value Date/Time   COLORURINE AMBER (A) 09/15/2023 1311   APPEARANCEUR HAZY (A) 09/15/2023 1311   APPEARANCEUR Clear 05/09/2014 1146   LABSPEC 1.030 09/15/2023 1311   LABSPEC 1.056 05/09/2014 1146   PHURINE 5.0 09/15/2023 1311   GLUCOSEU NEGATIVE 09/15/2023 1311   GLUCOSEU Negative 05/09/2014 1146   HGBUR SMALL (A) 09/15/2023 1311   BILIRUBINUR SMALL (A) 09/15/2023 1311   BILIRUBINUR Negative 05/09/2014 1146   KETONESUR NEGATIVE 09/15/2023 1311   PROTEINUR 30 (A) 09/15/2023 1311   UROBILINOGEN 0.2 05/28/2015 1152   NITRITE NEGATIVE 09/15/2023 1311   LEUKOCYTESUR NEGATIVE 09/15/2023 1311   LEUKOCYTESUR Negative 05/09/2014 1146   Sepsis Labs Recent Labs  Lab 07/27/24 1637 07/29/24 1702 07/30/24 0310  WBC 13.1* 13.8* 9.2   Microbiology Recent Results (from the past 240 hours)  Blood culture (routine x 2)     Status: None (Preliminary result)   Collection Time: 07/29/24  5:02 PM   Specimen: BLOOD  Result Value Ref Range Status   Specimen Description BLOOD LEFT ANTECUBITAL  Final   Special Requests   Final    BOTTLES DRAWN AEROBIC AND ANAEROBIC Blood Culture adequate volume   Culture   Final    NO GROWTH 2 DAYS Performed at [facility], [address] [place], [place] [address]    Report Status PENDING  Incomplete  Blood culture (routine x 2)     Status: None (Preliminary result)   Collection Time: 07/29/24  5:07 PM   Specimen: BLOOD  Result Value Ref Range Status   Specimen  Description BLOOD RIGHT ANTECUBITAL  Final   Special Requests   Final    BOTTLES DRAWN AEROBIC AND ANAEROBIC Blood Culture results may not be optimal due to an inadequate volume of blood received in culture bottles   Culture   Final    NO GROWTH 2 DAYS Performed at [facility], [address] [place], [place] [address]    Report Status PENDING  Incomplete     Total time spend on discharging this patient, including the last patient exam, discussing the hospital stay, instructions for ongoing care as it relates to all pertinent caregivers, as well as preparing the medical discharge records, prescriptions, and/or referrals as applicable, is 30 minutes.    Ellouise Haber, MD  [facility] 07/31/2024, 2:23  PM

## 2024-08-03 ENCOUNTER — Other Ambulatory Visit: Payer: Self-pay

## 2024-08-03 ENCOUNTER — Emergency Department
Admission: EM | Admit: 2024-08-03 | Discharge: 2024-08-03 | Disposition: A | Discharge status: Home / Self Care | Attending: Emergency Medicine | Admitting: Emergency Medicine

## 2024-08-03 ENCOUNTER — Emergency Department

## 2024-08-03 ENCOUNTER — Encounter: Payer: Self-pay | Admitting: Emergency Medicine

## 2024-08-03 DIAGNOSIS — R112 Nausea with vomiting, unspecified: Secondary | ICD-10-CM | POA: Diagnosis present

## 2024-08-03 DIAGNOSIS — R1084 Generalized abdominal pain: Secondary | ICD-10-CM | POA: Insufficient documentation

## 2024-08-03 DIAGNOSIS — I4581 Long QT syndrome: Secondary | ICD-10-CM | POA: Diagnosis not present

## 2024-08-03 DIAGNOSIS — Z87891 Personal history of nicotine dependence: Secondary | ICD-10-CM | POA: Diagnosis not present

## 2024-08-03 DIAGNOSIS — R1115 Cyclical vomiting syndrome unrelated to migraine: Secondary | ICD-10-CM

## 2024-08-03 DIAGNOSIS — G43A0 Cyclical vomiting, not intractable: Secondary | ICD-10-CM | POA: Diagnosis not present

## 2024-08-03 DIAGNOSIS — N179 Acute kidney failure, unspecified: Secondary | ICD-10-CM

## 2024-08-03 DIAGNOSIS — E876 Hypokalemia: Secondary | ICD-10-CM

## 2024-08-03 DIAGNOSIS — E872 Acidosis, unspecified: Secondary | ICD-10-CM

## 2024-08-03 DIAGNOSIS — R9431 Abnormal electrocardiogram [ECG] [EKG]: Secondary | ICD-10-CM

## 2024-08-03 LAB — COMPREHENSIVE METABOLIC PANEL WITH GFR
ALT: 29 U/L (ref 0–44)
AST: 27 U/L (ref 15–41)
Albumin: 4.8 g/dL (ref 3.5–5.0)
Alkaline Phosphatase: 74 U/L (ref 38–126)
Anion gap: 18 — ABNORMAL HIGH (ref 5–15)
BUN: 27 mg/dL — ABNORMAL HIGH (ref 6–20)
CO2: 20 mmol/L — ABNORMAL LOW (ref 22–32)
Calcium: 9.5 mg/dL (ref 8.9–10.3)
Chloride: 94 mmol/L — ABNORMAL LOW (ref 98–111)
Creatinine, Ser: 1.35 mg/dL — ABNORMAL HIGH (ref 0.61–1.24)
GFR, Estimated: >60 mL/min (ref >60)
Glucose, Bld: 151 mg/dL — ABNORMAL HIGH (ref 70–99)
Potassium: 3 mmol/L — ABNORMAL LOW (ref 3.5–5.1)
Sodium: 132 mmol/L — ABNORMAL LOW (ref 135–145)
Total Bilirubin: 1.6 mg/dL — ABNORMAL HIGH (ref 0.0–1.2)
Total Protein: 8.1 g/dL (ref 6.5–8.1)

## 2024-08-03 LAB — LACTIC ACID, PLASMA
Lactic Acid, Venous: 2.3 mmol/L (ref 0.5–1.9)
Lactic Acid, Venous: 1.4 mmol/L (ref 0.5–1.9)

## 2024-08-03 LAB — LIPASE, BLOOD: Lipase: 31 U/L (ref 11–51)

## 2024-08-03 LAB — CBC WITH DIFFERENTIAL/PLATELET
Abs Immature Granulocytes: 0.11 K/uL — ABNORMAL HIGH (ref 0.00–0.07)
Basophils Absolute: 0.1 K/uL (ref 0.0–0.1)
Basophils Relative: 0 %
Eosinophils Absolute: 0.1 K/uL (ref 0.0–0.5)
Eosinophils Relative: 0 %
HCT: 51.3 % (ref 39.0–52.0)
Hemoglobin: 18.1 g/dL — ABNORMAL HIGH (ref 13.0–17.0)
Immature Granulocytes: 1 %
Lymphocytes Relative: 27 %
Lymphs Abs: 3.6 K/uL (ref 0.7–4.0)
MCH: 31.2 pg (ref 26.0–34.0)
MCHC: 35.3 g/dL (ref 30.0–36.0)
MCV: 88.3 fL (ref 80.0–100.0)
Monocytes Absolute: 1.1 K/uL — ABNORMAL HIGH (ref 0.1–1.0)
Monocytes Relative: 8 %
Neutro Abs: 8.6 K/uL — ABNORMAL HIGH (ref 1.7–7.7)
Neutrophils Relative %: 64 %
Platelets: 294 K/uL (ref 150–400)
RBC: 5.81 MIL/uL (ref 4.22–5.81)
RDW: 12.8 % (ref 11.5–15.5)
WBC: 13.4 K/uL — ABNORMAL HIGH (ref 4.0–10.5)
nRBC: 0 % (ref 0.0–0.2)

## 2024-08-03 LAB — CULTURE, BLOOD (ROUTINE X 2)
Culture: NO GROWTH
Culture: NO GROWTH
Special Requests: ADEQUATE

## 2024-08-03 LAB — MAGNESIUM: Magnesium: 2.2 mg/dL (ref 1.7–2.4)

## 2024-08-03 LAB — TROPONIN I (HIGH SENSITIVITY)
Troponin I (High Sensitivity): 8 ng/L (ref <18)
Troponin I (High Sensitivity): 8 ng/L (ref <18)

## 2024-08-03 MED ORDER — DROPERIDOL 2.5 MG/ML IJ SOLN
2.5000 mg | Freq: Once | INTRAMUSCULAR | Status: AC
  Administered 2024-08-03: 2.5 mg via INTRAVENOUS
  Filled 2024-08-03: qty 2

## 2024-08-03 MED ORDER — PANTOPRAZOLE SODIUM 40 MG IV SOLR
80.0000 mg | Freq: Once | INTRAVENOUS | Status: AC
  Administered 2024-08-03: 80 mg via INTRAVENOUS
  Filled 2024-08-03: qty 20

## 2024-08-03 MED ORDER — ONDANSETRON HCL 4 MG/2ML IJ SOLN: 4.0000 mg | Freq: Once | INTRAMUSCULAR | Status: DC

## 2024-08-03 MED ORDER — SODIUM CHLORIDE 0.9 % IV BOLUS
1000.0000 mL | Freq: Once | INTRAVENOUS | Status: AC
  Administered 2024-08-03: 1000 mL via INTRAVENOUS

## 2024-08-03 MED ORDER — POTASSIUM CHLORIDE 10 MEQ/100ML IV SOLN
10.0000 meq | INTRAVENOUS | Status: DC
  Administered 2024-08-03: 10 meq via INTRAVENOUS
  Filled 2024-08-03: qty 100

## 2024-08-03 NOTE — ED Triage Notes (Signed)
 Patient ambulatory to triage with steady gait, without difficulty or distress noted; pt reports having N/V and abd pain x wk; seen for same and st dx with ulcers; taking rx meds without relief

## 2024-08-03 NOTE — ED Provider Notes (Addendum)
 [facility] Provider Note    Event Date/Time   First MD Initiated Contact with Patient 08/03/24 [phone]     (approximate)   History   Abdominal Pain   HPI  Tyler Bennett is a 53 y.o. male   Past medical history of esophageal ulcer, peptic ulcer disease, prior marijuana use, here with upper abdominal discomfort and nausea and vomiting since last few nights, worsening.  This is reminiscent of the symptoms that brought him to the hospital for a hospitalization earlier this week.  He left the hospital feeling well but after being discharged later that night symptoms resumed.  Denies GI bleeding.  Has tried to be compliant with all medications including his proton pump inhibitor for his esophageal ulcers but he has been vomiting these medications up.  He stopped smoking weed 2 weeks ago.  Denies significant NSAID use.  Epigastric pain only, nonradiating.  Denies frank chest pain or shortness of breath.   External Medical Documents Reviewed: Hospitalization notes from earlier this week, as well as gastroenterology note thinking that this may be a cyclical vomiting/cannabis hyperemesis syndrome      Physical Exam   Triage Vital Signs: ED Triage Vitals  Encounter Vitals Group     BP 08/03/24 0529 (!) 125/102     Girls Systolic BP Percentile --      Girls Diastolic BP Percentile --      Boys Systolic BP Percentile --      Boys Diastolic BP Percentile --      Pulse Rate 08/03/24 0529 68     Resp 08/03/24 0529 18     Temp 08/03/24 0529 98 F (36.7 C)     Temp Source 08/03/24 0529 Oral     SpO2 08/03/24 0529 100 %     Weight 08/03/24 0525 170 lb (77.1 kg)     Height 08/03/24 0525 5' 8 (1.727 m)     Head Circumference --      Peak Flow --      Pain Score 08/03/24 0525 8     Pain Loc --      Pain Education --      Exclude from Growth Chart --     Most recent vital signs: Vitals:   08/03/24 0529  BP: (!) 125/102  Pulse: 68  Resp: 18  Temp:  98 F (36.7 C)  SpO2: 100%    General: Awake, no distress.  CV:  Good peripheral perfusion.  Resp:  Normal effort.  Abd:  No distention.  Other:  Appropriate gentleman with normal vital signs and afebrile, with mild epigastric tenderness to palpation but no rigidity or guarding, nonperitoneal exam.  Skin appears warm and well-perfused.  Auscultation of the chest, clear lungs, no crepitus on palpation.   ED Results / Procedures / Treatments   Labs (all labs ordered are listed, but only abnormal results are displayed) Labs Reviewed  CBC WITH DIFFERENTIAL/PLATELET - Abnormal; Notable for the following components:      Result Value   WBC 13.4 (*)    Hemoglobin 18.1 (*)    Neutro Abs 8.6 (*)    Monocytes Absolute 1.1 (*)    Abs Immature Granulocytes 0.11 (*)    All other components within normal limits  LACTIC ACID, PLASMA - Abnormal; Notable for the following components:   Lactic Acid, Venous 2.3 (*)    All other components within normal limits  COMPREHENSIVE METABOLIC PANEL WITH GFR  LIPASE, BLOOD  URINALYSIS, ROUTINE W  REFLEX MICROSCOPIC  MAGNESIUM  LACTIC ACID, PLASMA  TROPONIN I (HIGH SENSITIVITY)     I ordered and reviewed the above labs they are notable for lactic acidosis 2.3  EKG  ED ECG REPORT I, Ginnie Shams, the attending physician, personally viewed and interpreted this ECG.   Date: 08/03/2024  EKG Time: 0626  Rate: 86  Rhythm: sinus  Axis: nl  Intervals:nl  ST&T Change: no stemi    RADIOLOGY I independently reviewed and interpreted upright chest x-ray and see no obvious pneumomediastinum or abdominal free air I also reviewed radiologist's formal read.   PROCEDURES:  Critical Care performed: Yes, see critical care procedure note(s)  .Critical Care  Performed by: Shams Ginnie, MD Authorized by: Shams Ginnie, MD   Critical care provider statement:    Critical care time (minutes):  30   Critical care was time spent personally by me on the following  activities:  Development of treatment plan with patient or surrogate, discussions with consultants, evaluation of patient's response to treatment, examination of patient, ordering and review of laboratory studies, ordering and review of radiographic studies, ordering and performing treatments and interventions, pulse oximetry, re-evaluation of patient's condition and review of old charts    MEDICATIONS ORDERED IN ED: Medications  sodium chloride  0.9 % bolus 1,000 mL (1,000 mLs Intravenous New Bag/Given 08/03/24 0612)  pantoprazole  (PROTONIX ) injection 80 mg (80 mg Intravenous Given 08/03/24 0613)  droperidol  (INAPSINE ) 2.5 MG/ML injection 2.5 mg (2.5 mg Intravenous Given 08/03/24 0612)     IMPRESSION / MDM / ASSESSMENT AND PLAN / ED COURSE  I reviewed the triage vital signs and the nursing notes.                                Patient's presentation is most consistent with acute presentation with potential threat to life or bodily function.  Differential diagnosis includes, but is not limited to, cyclic vomiting syndrome, pancreatitis, biliary colic, cholecystitis, perforated viscus, esophageal rupture, electrolyte abnormalities, intra-abdominal infection, ACS   The patient is on the cardiac monitor to evaluate for evidence of arrhythmia and/or significant heart rate changes.  MDM: Most likely another flareup of his cyclic vomiting syndrome as these symptoms match the ones that brought him in the hospital couple days ago, steadily worsening since being home.    Will start with fluids, droperidol , check labs including electrolytes.  Doubt cardiac ischemia but with pain radiating up through the chest in the setting of vomiting I think it prudent to check EKG and troponin.  Check chest x-ray for any evidence of free air in the pneumomediastinum or intra-abdominal, but with a fairly benign abdominal exam I doubt.  Defer advanced imaging CT given recent imaging, benign abdomen.  -- Initial  labs concerning for hemoconcentrated cell counts similar to when he first arrived for his hospitalization which improved after hydration and at discharge, as well as lactic acidosis which is similar to prior presentation likely hypoperfusion in the setting of dehydration in the setting of poor p.o. intake and vomiting rather than active infection similar to last hospitalization.  Will recheck lactic after hydration.  At the time of signout disposition is pending the remainder of his labs as above and reassessment after initial treatment.  Will defer further antiemetic at this time until electrolytes resulted, as his QTc is prolonged on initial EKG.      FINAL CLINICAL IMPRESSION(S) / ED DIAGNOSES   Final diagnoses:  Generalized  abdominal pain  Nausea and vomiting, unspecified vomiting type  Cyclical vomiting  QT prolongation     Rx / DC Orders   ED Discharge Orders     None        Note:  This document was prepared using Dragon voice recognition software and may include unintentional dictation errors.    Cyrena Mylar, MD 08/03/24 [id number]    Cyrena Mylar, MD 08/03/24 [phone]

## 2024-08-03 NOTE — ED Notes (Signed)
 Pt was asked if he could provide a urine sample. The pt advised he hasn't been able to yet.

## 2024-08-03 NOTE — ED Notes (Signed)
 I entered the pt's room due to the pt's monitor ringing. The pt advised he could not stay here. The pt advised he couldn't get any rest and he wanted to go home. I told the pt he had the right to leave however, I advised the pt that I hated for him to go home and then have to come back due to the same symptoms. I asked the pt if he could please stay and talk to the doctor. The pt advised he could wait then a short while later advise he needed to leave. The pt signed an AMA form. Dr. Clarine made aware the pt left AMA. The pt was encouraged to come back for any further assistance.

## 2024-08-03 NOTE — ED Provider Notes (Signed)
  Physical Exam  BP (!) 125/102 (BP Location: Right Arm)   Pulse 84   Temp 98 F (36.7 C) (Oral)   Resp 18   Ht 5' 8 (1.727 m)   Wt 77.1 kg   SpO2 100%   BMI 25.85 kg/m   Physical Exam  Procedures  Procedures  ED Course / MDM   Clinical Course as of 08/03/24 0813  Thu Aug 03, 2024  0704 S/o from Dr. Cyrena: - 6M hx cyclic vomiting, recent admit for same - hx esophageal ulcer - has had recent endoscopy, recent eval by GI on last admit - exam reassuring, last CT a couple days ago w/ no acute path - XR abdomen wnl - Qtc mildly elevated - hemoconcentrated, lactate elevated  TO DO: - re-eval [MM]  9187 I was just approached by bedside nurse that patient just eloped from the emergency department  She did have a conversation with him requesting him to stay for reevaluation but he declined, ultimately signed out AMA  I did not perform reevaluation yet on this patient.  Was signed out to me about 1 hour ago with plan for repeat abdominal exam and p.o. challenge.  Not clinically concern for acute intra-abdominal pathology on initial eval by prior provider, known history of cyclic vomiting and felt to have similar presentation today. [MM]    Clinical Course User Index [MM] Clarine Ozell LABOR, MD   Medical Decision Making Amount and/or Complexity of Data Reviewed Labs: ordered. Radiology: ordered.  Risk Prescription drug management.          Clarine Ozell LABOR, MD 08/03/24 [phone]

## 2024-12-01 ENCOUNTER — Other Ambulatory Visit: Payer: Self-pay
